# Patient Record
Sex: Female | Born: 1985
Health system: Southern US, Community
[De-identification: ages and names within clinical notes are randomized; demographics above are authoritative.]

## PROBLEM LIST (undated history)

## (undated) DIAGNOSIS — T7840XA Allergy, unspecified, initial encounter: Secondary | ICD-10-CM

## (undated) HISTORY — DX: Allergy, unspecified, initial encounter: T78.40XA

---
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
## 2005-07-01 ENCOUNTER — Other Ambulatory Visit: Admission: RE | Admit: 2005-07-01 | Discharge: 2005-07-01 | Payer: Self-pay | Admitting: Family Medicine

## 2007-03-08 ENCOUNTER — Emergency Department (HOSPITAL_COMMUNITY): Admission: EM | Admit: 2007-03-08 | Discharge: 2007-03-08 | Payer: Self-pay | Admitting: Emergency Medicine

## 2009-03-07 ENCOUNTER — Emergency Department (HOSPITAL_COMMUNITY): Admission: EM | Admit: 2009-03-07 | Discharge: 2009-03-07 | Payer: Self-pay | Admitting: Internal Medicine

## 2012-04-23 ENCOUNTER — Telehealth: Payer: Self-pay | Admitting: Nurse Practitioner

## 2012-04-24 NOTE — Telephone Encounter (Signed)
LMTCB 4/21-NO RETURN CALL YET- BEEN OVER 24 HOURS. PT MAY CALL BACK IF APPT STILL NEEDED

## 2012-10-02 ENCOUNTER — Ambulatory Visit (INDEPENDENT_AMBULATORY_CARE_PROVIDER_SITE_OTHER): Payer: Managed Care, Other (non HMO) | Admitting: Nurse Practitioner

## 2012-10-02 ENCOUNTER — Telehealth: Payer: Self-pay | Admitting: Nurse Practitioner

## 2012-10-02 ENCOUNTER — Encounter: Payer: Self-pay | Admitting: Nurse Practitioner

## 2012-10-02 VITALS — BP 112/75 | HR 86 | Temp 97.8°F | Ht 65.5 in | Wt 107.0 lb

## 2012-10-02 DIAGNOSIS — Z01419 Encounter for gynecological examination (general) (routine) without abnormal findings: Secondary | ICD-10-CM

## 2012-10-02 DIAGNOSIS — Z0000 Encounter for general adult medical examination without abnormal findings: Secondary | ICD-10-CM

## 2012-10-02 DIAGNOSIS — Z124 Encounter for screening for malignant neoplasm of cervix: Secondary | ICD-10-CM

## 2012-10-02 LAB — POCT URINALYSIS DIPSTICK
Blood, UA: NEGATIVE
Glucose, UA: NEGATIVE
Protein, UA: NEGATIVE
Spec Grav, UA: 1.02
pH, UA: 7

## 2012-10-02 MED ORDER — NORETHIN-ETH ESTRAD TRIPHASIC 0.5/0.75/1-35 MG-MCG PO TABS: 1.0000 | ORAL_TABLET | Freq: Every day | ORAL | Status: DC

## 2012-10-02 MED ORDER — IMIQUIMOD 5 % EX CREA: TOPICAL_CREAM | CUTANEOUS | Status: DC

## 2012-10-02 NOTE — Progress Notes (Addendum)
  Subjective:    Patient ID: Amy Ingram, female    DOB: 05-29-1985, 27 y.o.   MRN: [id number]  HPI Patient in today for CPE and PAP. Patoent doing well- no complaints today    Review of Systems  Constitutional: Negative.   HENT: Negative.   Eyes: Negative.   Respiratory: Negative.   Cardiovascular: Negative.   Gastrointestinal: Negative.   Genitourinary: Negative.   Musculoskeletal: Negative.   Neurological: Negative.   Hematological: Negative.        Objective:   Physical Exam  Constitutional: She is oriented to person, place, and time. She appears well-developed and well-nourished.  HENT:  Head: Normocephalic.  Right Ear: Hearing, tympanic membrane, external ear and ear canal normal.  Left Ear: Hearing, tympanic membrane, external ear and ear canal normal.  Nose: Nose normal.  Mouth/Throat: Uvula is midline and oropharynx is clear and moist.  Eyes: Conjunctivae and EOM are normal. Pupils are equal, round, and reactive to light.  Neck: Normal range of motion and full passive range of motion without pain. Neck supple. No JVD present. Carotid bruit is not present. No mass and no thyromegaly present.  Cardiovascular: Normal rate, normal heart sounds and intact distal pulses.   No murmur heard. Pulmonary/Chest: Effort normal and breath sounds normal.  Abdominal: Soft. Bowel sounds are normal. She exhibits no mass. There is no tenderness.  Genitourinary: Vagina normal and uterus normal. No breast swelling, tenderness, discharge or bleeding.  bimanual exam-No adnexal masses or tenderness.  Breast- no masses palpable  Musculoskeletal: Normal range of motion.  Lymphadenopathy:    She has no cervical adenopathy.  Neurological: She is alert and oriented to person, place, and time.  Skin: Skin is warm and dry.  Psychiatric: She has a normal mood and affect. Her behavior is normal. Judgment and thought content normal.     BP 112/75  Pulse 86  Temp(Src) 97.8 F (36.6 C)  (Oral)  Ht 5' 5.5" (1.664 m)  Wt 107 lb (48.535 kg)  BMI 17.53 kg/m2      Assessment & Plan:   1. Annual physical exam   2. Encounter for routine gynecological examination    Orders Placed This Encounter  Procedures  . Urinalysis Dipstick  . POCT hemoglobin   Meds ordered this encounter  Medications  . DISCONTD: norethindrone-ethinyl estradiol (TRIPHASIL,CYCLAFEM,ALYACEN) 0.5/0.75/1-35 MG-MCG tablet    Sig: Take 1 tablet by mouth daily.  . imiquimod (ALDARA) 5 % cream    Sig: Apply topically 3 (three) times a week.    Dispense:  12 each    Refill:  0    Order Specific Question:  Supervising Provider    Answer:  Ernestina Penna [[id number]]  . norethindrone-ethinyl estradiol (TRIPHASIL,CYCLAFEM,ALYACEN) 0.5/0.75/1-35 MG-MCG tablet    Sig: Take 1 tablet by mouth daily.    Dispense:  1 Package    Refill:  11    Order Specific Question:  Supervising Provider    Answer:  Ernestina Penna [[id number]]   LABs pending Increase calories in diet  .mmmms

## 2012-10-02 NOTE — Patient Instructions (Signed)

## 2012-10-03 NOTE — Telephone Encounter (Signed)
I ordered what was in computer- call pharmacy and see what she was on so we can compare

## 2012-10-03 NOTE — Telephone Encounter (Signed)
Please advise 

## 2012-10-04 NOTE — Telephone Encounter (Signed)
Taken care.

## 2012-10-05 LAB — PAP IG, CT-NG, RFX HPV ASCU

## 2012-10-25 ENCOUNTER — Encounter: Payer: Self-pay | Admitting: *Deleted

## 2013-03-04 ENCOUNTER — Telehealth: Payer: Self-pay | Admitting: Nurse Practitioner

## 2013-03-04 NOTE — Telephone Encounter (Signed)
appt tomorrow with mmm 

## 2013-03-05 ENCOUNTER — Ambulatory Visit (INDEPENDENT_AMBULATORY_CARE_PROVIDER_SITE_OTHER): Payer: Managed Care, Other (non HMO) | Admitting: Nurse Practitioner

## 2013-03-05 ENCOUNTER — Encounter: Payer: Self-pay | Admitting: Nurse Practitioner

## 2013-03-05 VITALS — BP 112/69 | HR 80 | Temp 97.4°F | Ht 65.5 in | Wt 108.0 lb

## 2013-03-05 DIAGNOSIS — N9089 Other specified noninflammatory disorders of vulva and perineum: Secondary | ICD-10-CM

## 2013-03-05 DIAGNOSIS — R300 Dysuria: Secondary | ICD-10-CM

## 2013-03-05 LAB — POCT URINALYSIS DIPSTICK
Bilirubin, UA: NEGATIVE
GLUCOSE UA: NEGATIVE
Ketones, UA: NEGATIVE
Leukocytes, UA: NEGATIVE
Nitrite, UA: NEGATIVE
PROTEIN UA: NEGATIVE
Spec Grav, UA: 1.02
Urobilinogen, UA: NEGATIVE
pH, UA: 6.5

## 2013-03-05 LAB — POCT UA - MICROSCOPIC ONLY
Bacteria, U Microscopic: NEGATIVE
CRYSTALS, UR, HPF, POC: NEGATIVE
Casts, Ur, LPF, POC: NEGATIVE
Mucus, UA: NEGATIVE
WBC, Ur, HPF, POC: NEGATIVE
Yeast, UA: NEGATIVE

## 2013-03-05 MED ORDER — METRONIDAZOLE 500 MG PO TABS: 500.0000 mg | ORAL_TABLET | Freq: Two times a day (BID) | ORAL | Status: DC

## 2013-03-05 NOTE — Progress Notes (Signed)
Subjective:    Patient ID: Amy Ingram, female    DOB: 08/18/1985, 28 y.o.   MRN: [id number]  Urinary Tract Infection  This is a new problem. The current episode started in the past 7 days. The problem occurs intermittently. The problem has been unchanged. The pain is at a severity of 6/10. The pain is moderate. There has been no fever. She is sexually active. Associated symptoms include frequency and urgency. Pertinent negatives include no discharge, flank pain or hematuria. She has tried increased fluids for the symptoms. The treatment provided no relief. Her past medical history is significant for kidney stones.     Review of Systems  Respiratory: Negative for shortness of breath.   Cardiovascular: Negative for chest pain.  Genitourinary: Positive for urgency and frequency. Negative for hematuria and flank pain.  All other systems reviewed and are negative.       Objective:   Physical Exam  Constitutional: She is oriented to person, place, and time. She appears well-developed and well-nourished.  Eyes: EOM are normal.  Neck: Trachea normal and full passive range of motion without pain. Carotid bruit is not present.  Cardiovascular: Normal rate, regular rhythm, normal heart sounds and intact distal pulses.   Pulmonary/Chest: Effort normal and breath sounds normal.  Abdominal: Soft. Bowel sounds are normal. There is tenderness.  Genitourinary:  Erythematous, raw appearing lesion of the clitoris   Musculoskeletal: Normal range of motion.  Neurological: She is alert and oriented to person, place, and time. She has normal reflexes.  Skin: Skin is warm and dry.  Psychiatric: She has a normal mood and affect. Her behavior is normal. Judgment and thought content normal.    BP 112/69  Pulse 80  Temp(Src) 97.4 F (36.3 C) (Oral)  Ht 5' 5.5" (1.664 m)  Wt 108 lb (48.988 kg)  BMI 17.69 kg/m2   Results for orders placed in visit on 03/05/13  POCT URINALYSIS DIPSTICK   Result Value Ref Range   Color, UA yellow     Clarity, UA clear     Glucose, UA neg     Bilirubin, UA neg     Ketones, UA neg     Spec Grav, UA 1.020     Blood, UA trace     pH, UA 6.5     Protein, UA neg     Urobilinogen, UA negative     Nitrite, UA neg     Leukocytes, UA Negative    POCT UA - MICROSCOPIC ONLY      Result Value Ref Range   WBC, Ur, HPF, POC neg     RBC, urine, microscopic 5-10     Bacteria, U Microscopic neg     Mucus, UA neg     Epithelial cells, urine per micros occ     Crystals, Ur, HPF, POC neg     Casts, Ur, LPF, POC neg     Yeast, UA neg         Assessment & Plan:   1. Clitoral irritation   2. Dysuria     Meds ordered this encounter  Medications  . metroNIDAZOLE (FLAGYL) 500 MG tablet    Sig: Take 1 tablet (500 mg total) by mouth 2 (two) times daily.    Dispense:  14 tablet    Refill:  0    Order Specific Question:  Supervising Provider    Answer:  Ernestina PennaMOORE, DONALD W [[id number]]   Apply vasaline or antibiotic BID daily Cold or warm compresses  whichever feels better RTC if no improvement or worsens  Mary-Margaret Daphine Deutscher, FNP

## 2013-07-29 ENCOUNTER — Encounter: Payer: Self-pay | Admitting: Family Medicine

## 2013-07-29 ENCOUNTER — Ambulatory Visit (INDEPENDENT_AMBULATORY_CARE_PROVIDER_SITE_OTHER): Payer: Managed Care, Other (non HMO) | Admitting: Family Medicine

## 2013-07-29 VITALS — BP 116/77 | HR 82 | Temp 97.0°F | Ht 65.5 in | Wt 115.2 lb

## 2013-07-29 DIAGNOSIS — B353 Tinea pedis: Secondary | ICD-10-CM

## 2013-07-29 DIAGNOSIS — H8110 Benign paroxysmal vertigo, unspecified ear: Secondary | ICD-10-CM

## 2013-07-29 DIAGNOSIS — R531 Weakness: Secondary | ICD-10-CM

## 2013-07-29 DIAGNOSIS — R110 Nausea: Secondary | ICD-10-CM

## 2013-07-29 DIAGNOSIS — R5383 Other fatigue: Secondary | ICD-10-CM

## 2013-07-29 DIAGNOSIS — R5381 Other malaise: Secondary | ICD-10-CM

## 2013-07-29 LAB — POCT URINALYSIS DIPSTICK
Bilirubin, UA: NEGATIVE
Blood, UA: NEGATIVE
Glucose, UA: NEGATIVE
Ketones, UA: NEGATIVE
Leukocytes, UA: NEGATIVE
Nitrite, UA: NEGATIVE
Protein, UA: NEGATIVE
Spec Grav, UA: <=1.005
Urobilinogen, UA: NEGATIVE
pH, UA: 7.5

## 2013-07-29 LAB — POCT UA - MICROSCOPIC ONLY
Bacteria, U Microscopic: NEGATIVE
Casts, Ur, LPF, POC: NEGATIVE
Crystals, Ur, HPF, POC: NEGATIVE
Mucus, UA: NEGATIVE
RBC, urine, microscopic: NEGATIVE
Yeast, UA: NEGATIVE

## 2013-07-29 LAB — POCT URINE PREGNANCY: Preg Test, Ur: NEGATIVE

## 2013-07-29 MED ORDER — KETOCONAZOLE 2 % EX CREA: 1.0000 "application " | TOPICAL_CREAM | Freq: Every day | CUTANEOUS | Status: DC

## 2013-07-29 MED ORDER — MECLIZINE HCL 25 MG PO TABS: 25.0000 mg | ORAL_TABLET | Freq: Three times a day (TID) | ORAL | Status: DC | PRN

## 2013-07-29 MED ORDER — AZITHROMYCIN 250 MG PO TABS: ORAL_TABLET | ORAL | Status: DC

## 2013-07-29 MED ORDER — METHYLPREDNISOLONE ACETATE 80 MG/ML IJ SUSP
80.0000 mg | Freq: Once | INTRAMUSCULAR | Status: AC
Start: 2013-07-29 — End: 2013-07-29
  Administered 2013-07-29: 80 mg via INTRAMUSCULAR

## 2013-07-29 MED ORDER — TERBINAFINE HCL 250 MG PO TABS: 250.0000 mg | ORAL_TABLET | Freq: Every day | ORAL | Status: DC

## 2013-07-29 NOTE — Progress Notes (Signed)
   Subjective:    Patient ID: Amy Ingram, female    DOB: May 27, 1985, 28 y.o.   MRN: [id number]  HPI This 28 y.o. female presents for evaluation of dizziness and spells.  She states she has been having a few episodes over the past few weeks.  She is having a rash in her right toes.   Review of Systems C/o rash No chest pain, SOB, HA, dizziness, vision change, N/V, diarrhea, constipation, dysuria, urinary urgency or frequency, myalgias, arthralgias or rash.     Objective:   Physical Exam Vital signs noted  Well developed well nourished female.  HEENT - Head atraumatic Normocephalic                Eyes - PERRLA, Conjuctiva - clear Sclera- Clear EOMI                Ears - EAC's Wnl TM's Wnl Gross Hearing WNL                Throat - oropharanx wnl Respiratory - Lungs CTA bilateral Cardiac - RRR S1 and S2 without murmur GI - Abdomen soft Nontender and bowel sounds active x 4 Extremities - No edema. Neuro - Grossly intact.   Results for orders placed in visit on 07/29/13  POCT URINALYSIS DIPSTICK      Result Value Ref Range   Color, UA yellow     Clarity, UA clear     Glucose, UA neg     Bilirubin, UA neg     Ketones, UA neg     Spec Grav, UA <=1.005     Blood, UA neg     pH, UA 7.5     Protein, UA neg     Urobilinogen, UA negative     Nitrite, UA neg     Leukocytes, UA Negative    POCT URINE PREGNANCY      Result Value Ref Range   Preg Test, Ur Negative    POCT UA - MICROSCOPIC ONLY      Result Value Ref Range   WBC, Ur, HPF, POC rare     RBC, urine, microscopic neg     Bacteria, U Microscopic neg     Mucus, UA neg     Epithelial cells, urine per micros occ     Crystals, Ur, HPF, POC neg     Casts, Ur, LPF, POC neg     Yeast, UA neg        Assessment & Plan:  Weakness generalized - Plan: POCT urinalysis dipstick, POCT urine pregnancy, POCT UA - Microscopic Only, azithromycin (ZITHROMAX) 250 MG tablet, meclizine (ANTIVERT) 25 MG tablet, methylPREDNISolone  acetate (DEPO-MEDROL) injection 80 mg  Nausea alone - Plan: POCT urinalysis dipstick, POCT urine pregnancy, POCT UA - Microscopic Only, azithromycin (ZITHROMAX) 250 MG tablet, meclizine (ANTIVERT) 25 MG tablet, methylPREDNISolone acetate (DEPO-MEDROL) injection 80 mg  Benign paroxysmal positional vertigo, unspecified laterality - Plan: azithromycin (ZITHROMAX) 250 MG tablet, meclizine (ANTIVERT) 25 MG tablet, methylPREDNISolone acetate (DEPO-MEDROL) injection 80 mg  Tinea pedis of right foot - Plan: ketoconazole (NIZORAL) 2 % cream, terbinafine (LAMISIL) 250 MG tablet  Deatra CanterWilliam J Alby Schwabe FNP

## 2013-12-17 ENCOUNTER — Ambulatory Visit (INDEPENDENT_AMBULATORY_CARE_PROVIDER_SITE_OTHER): Payer: Managed Care, Other (non HMO) | Admitting: Nurse Practitioner

## 2013-12-17 ENCOUNTER — Encounter: Payer: Self-pay | Admitting: Nurse Practitioner

## 2013-12-17 VITALS — BP 95/70 | HR 79 | Temp 97.3°F | Ht 65.0 in | Wt 110.0 lb

## 2013-12-17 DIAGNOSIS — Z0000 Encounter for general adult medical examination without abnormal findings: Secondary | ICD-10-CM

## 2013-12-17 DIAGNOSIS — Z01419 Encounter for gynecological examination (general) (routine) without abnormal findings: Secondary | ICD-10-CM

## 2013-12-17 MED ORDER — CYCLOBENZAPRINE HCL 10 MG PO TABS: 10.0000 mg | ORAL_TABLET | Freq: Three times a day (TID) | ORAL | Status: DC | PRN

## 2013-12-17 NOTE — Patient Instructions (Signed)

## 2013-12-17 NOTE — Progress Notes (Signed)
   Subjective:    Patient ID: Amy Ingram, female    DOB: 1985/12/29, 28 y.o.   MRN: [id number]  HPI Patient here today for CPE and PAP- She is doing well today without complaints. Sh ehas no curent medical problems and is on no chronic meds.    Review of Systems  Constitutional: Negative.   HENT: Negative.   Respiratory: Negative.   Cardiovascular: Negative.   Gastrointestinal: Negative.   Genitourinary: Negative.   Neurological: Negative.   Psychiatric/Behavioral: Negative.   All other systems reviewed and are negative.      Objective:   Physical Exam  Constitutional: She is oriented to person, place, and time. She appears well-developed and well-nourished.  HENT:  Head: Normocephalic.  Right Ear: Hearing, tympanic membrane, external ear and ear canal normal.  Left Ear: Hearing, tympanic membrane, external ear and ear canal normal.  Nose: Nose normal.  Mouth/Throat: Uvula is midline and oropharynx is clear and moist.  Eyes: Conjunctivae and EOM are normal. Pupils are equal, round, and reactive to light.  Neck: Normal range of motion and full passive range of motion without pain. Neck supple. No JVD present. Carotid bruit is not present. No thyroid mass and no thyromegaly present.  Cardiovascular: Normal rate, normal heart sounds and intact distal pulses.   No murmur heard. Pulmonary/Chest: Effort normal and breath sounds normal. Right breast exhibits no inverted nipple, no mass, no nipple discharge, no skin change and no tenderness. Left breast exhibits no inverted nipple, no mass, no nipple discharge, no skin change and no tenderness.  Abdominal: Soft. Bowel sounds are normal. She exhibits no mass. There is no tenderness.  Genitourinary: Vagina normal and uterus normal. No breast swelling, tenderness, discharge or bleeding.  bimanual exam-No adnexal masses or tenderness. Cervix non parous and pink- no discharge Condyloma lesion on left perineum  Musculoskeletal: Normal  range of motion.  Lymphadenopathy:    She has no cervical adenopathy.  Neurological: She is alert and oriented to person, place, and time.  Skin: Skin is warm and dry.  Psychiatric: She has a normal mood and affect. Her behavior is normal. Judgment and thought content normal.   BP 95/70 mmHg  Pulse 79  Temp(Src) 97.3 F (36.3 C) (Oral)  Ht 5\' 5"  (1.651 m)  Wt 110 lb (49.896 kg)  BMI 18.31 kg/m2        Assessment & Plan:

## 2013-12-17 NOTE — Addendum Note (Signed)
Addended by: Tommas OlpHANDY, ASHLEY N on: 12/17/2013 11:22 AM   Modules accepted: Orders

## 2013-12-18 LAB — CBC WITH DIFFERENTIAL
BASOS ABS: 0 10*3/uL (ref 0.0–0.2)
Basos: 1 %
EOS: 3 %
Eosinophils Absolute: 0.2 10*3/uL (ref 0.0–0.4)
HEMATOCRIT: 41.4 % (ref 34.0–46.6)
HEMOGLOBIN: 14.1 g/dL (ref 11.1–15.9)
IMMATURE GRANS (ABS): 0 10*3/uL (ref 0.0–0.1)
IMMATURE GRANULOCYTES: 0 %
LYMPHS: 37 %
Lymphocytes Absolute: 2.2 10*3/uL (ref 0.7–3.1)
MCH: 31 pg (ref 26.6–33.0)
MCHC: 34.1 g/dL (ref 31.5–35.7)
MCV: 91 fL (ref 79–97)
MONOCYTES: 7 %
Monocytes Absolute: 0.4 10*3/uL (ref 0.1–0.9)
Neutrophils Absolute: 3.2 10*3/uL (ref 1.4–7.0)
Neutrophils Relative %: 52 %
PLATELETS: 220 10*3/uL (ref 150–379)
RBC: 4.55 x10E6/uL (ref 3.77–5.28)
RDW: 13.1 % (ref 12.3–15.4)
WBC: 6 10*3/uL (ref 3.4–10.8)

## 2013-12-18 LAB — CMP14+EGFR
ALBUMIN: 4.4 g/dL (ref 3.5–5.5)
ALT: 10 IU/L (ref 0–32)
AST: 10 IU/L (ref 0–40)
Albumin/Globulin Ratio: 2 (ref 1.1–2.5)
Alkaline Phosphatase: 55 IU/L (ref 39–117)
BILIRUBIN TOTAL: 0.6 mg/dL (ref 0.0–1.2)
BUN/Creatinine Ratio: 25 — ABNORMAL HIGH (ref 8–20)
BUN: 16 mg/dL (ref 6–20)
CO2: 22 mmol/L (ref 18–29)
Calcium: 9 mg/dL (ref 8.7–10.2)
Chloride: 102 mmol/L (ref 97–108)
Creatinine, Ser: 0.65 mg/dL (ref 0.57–1.00)
GFR, EST AFRICAN AMERICAN: 140 mL/min/{1.73_m2} (ref >59)
GFR, EST NON AFRICAN AMERICAN: 121 mL/min/{1.73_m2} (ref >59)
GLUCOSE: 79 mg/dL (ref 65–99)
Globulin, Total: 2.2 g/dL (ref 1.5–4.5)
Potassium: 4.4 mmol/L (ref 3.5–5.2)
Sodium: 138 mmol/L (ref 134–144)
TOTAL PROTEIN: 6.6 g/dL (ref 6.0–8.5)

## 2013-12-18 LAB — NMR, LIPOPROFILE
CHOLESTEROL: 154 mg/dL (ref 100–199)
HDL Cholesterol by NMR: 64 mg/dL (ref >39)
HDL Particle Number: 34.8 umol/L (ref >=30.5)
LDL PARTICLE NUMBER: 813 nmol/L (ref <1000)
LDL SIZE: 20.7 nm (ref >20.5)
LDL-C: 75 mg/dL (ref 0–99)
LP-IR Score: 43 (ref <=45)
SMALL LDL PARTICLE NUMBER: 253 nmol/L (ref <=527)
TRIGLYCERIDES BY NMR: 77 mg/dL (ref 0–149)

## 2013-12-18 LAB — THYROID PANEL WITH TSH
FREE THYROXINE INDEX: 2.6 (ref 1.2–4.9)
T3 UPTAKE RATIO: 35 % (ref 24–39)
T4, Total: 7.3 ug/dL (ref 4.5–12.0)
TSH: 0.733 u[IU]/mL (ref 0.450–4.500)

## 2013-12-20 ENCOUNTER — Telehealth: Payer: Self-pay

## 2013-12-20 LAB — PAP IG, CT-NG, RFX HPV ASCU
Chlamydia, Nuc. Acid Amp: NEGATIVE
Gonococcus by Nucleic Acid Amp: NEGATIVE
PAP Smear Comment: 0

## 2013-12-20 NOTE — Telephone Encounter (Signed)
-----   Message from [facility] Center[facility]-Margaret Martin, FNP sent at 12/19/2013  6:35 PM EST ----- Cbc- normal Kidney and liver function stable Cholesterol looks great Thyroid panel normal Continue current meds- low fat diet and exercise and recheck in 3 months

## 2013-12-20 NOTE — Telephone Encounter (Signed)
Letter sent with results

## 2014-07-04 ENCOUNTER — Emergency Department (HOSPITAL_COMMUNITY)
Admission: EM | Admit: 2014-07-04 | Discharge: 2014-07-04 | Disposition: A | Discharge status: Home / Self Care | Payer: 59 | Attending: Emergency Medicine | Admitting: Emergency Medicine

## 2014-07-04 ENCOUNTER — Encounter (HOSPITAL_COMMUNITY): Payer: Self-pay | Admitting: *Deleted

## 2014-07-04 DIAGNOSIS — S00412A Abrasion of left ear, initial encounter: Secondary | ICD-10-CM | POA: Diagnosis not present

## 2014-07-04 DIAGNOSIS — H6092 Unspecified otitis externa, left ear: Secondary | ICD-10-CM | POA: Diagnosis not present

## 2014-07-04 DIAGNOSIS — Y998 Other external cause status: Secondary | ICD-10-CM | POA: Insufficient documentation

## 2014-07-04 DIAGNOSIS — Y9389 Activity, other specified: Secondary | ICD-10-CM | POA: Diagnosis not present

## 2014-07-04 DIAGNOSIS — X58XXXA Exposure to other specified factors, initial encounter: Secondary | ICD-10-CM | POA: Diagnosis not present

## 2014-07-04 DIAGNOSIS — Y929 Unspecified place or not applicable: Secondary | ICD-10-CM | POA: Diagnosis not present

## 2014-07-04 DIAGNOSIS — H9222 Otorrhagia, left ear: Secondary | ICD-10-CM | POA: Diagnosis present

## 2014-07-04 DIAGNOSIS — Z720 Tobacco use: Secondary | ICD-10-CM | POA: Diagnosis not present

## 2014-07-04 MED ORDER — OFLOXACIN 0.3 % OT SOLN: 10.0000 [drp] | Freq: Every day | OTIC | Status: DC

## 2014-07-04 NOTE — ED Provider Notes (Signed)
CSN: [id number]     Arrival date & time 07/04/14  1728 History  This chart was scribed for non-physician practitioner, Eyvonne MechanicJeffrey Takisha Pelle, PA-C, working with No att. providers found by Bethel BornBritney McCollum, ED Scribe. This patient was seen in room TR10C/TR10C and the patient's care was started at 5:54 PM.    Chief Complaint  Patient presents with  . Ear Drainage    The history is provided by the patient. No language interpreter was used.   Amy Ingram is a 29 y.o. female with PMHx of seasonal allergies who presents to the Emergency Department complaining of bleeding from the left ear with onset around 5 PM. The pt was at work when she noticed dried blood around her ear.Associated symptoms include mild ear pain that is described as pressure. She notes cleaning her ears with Q-tips this morning. Pt denies hearing loss. No antihistamine use.   Past Medical History  Diagnosis Date  . Allergy    History reviewed. No pertinent past surgical history. No family history on file. History  Substance Use Topics  . Smoking status: Current Every Day Smoker  . Smokeless tobacco: Not on file  . Alcohol Use: Yes   OB History    No data available     Review of Systems  All other systems reviewed and are negative.    Allergies  Review of patient's allergies indicates no known allergies.  Home Medications   Prior to Admission medications   Medication Sig Start Date End Date Taking? Authorizing Provider  cyclobenzaprine (FLEXERIL) 10 MG tablet Take 1 tablet (10 mg total) by mouth 3 (three) times daily as needed for muscle spasms. 12/17/13   Mary-Margaret Daphine DeutscherMartin, FNP  imiquimod Mathis Dad(ALDARA) 5 % cream Apply topically 3 (three) times a week. Patient not taking: Reported on 12/17/2013 10/02/12   Mary-Margaret Daphine DeutscherMartin, FNP  ketoconazole (NIZORAL) 2 % cream Apply 1 application topically daily. Patient not taking: Reported on 12/17/2013 07/29/13   Deatra CanterWilliam J Oxford, FNP  meclizine (ANTIVERT) 25 MG tablet Take 1  tablet (25 mg total) by mouth 3 (three) times daily as needed for dizziness. Patient not taking: Reported on 12/17/2013 07/29/13   Deatra CanterWilliam J Oxford, FNP  ofloxacin (FLOXIN) 0.3 % otic solution Place 10 drops into the left ear daily. Please use for 7 days 07/04/14   Eyvonne MechanicJeffrey Deandra Goering, PA-C  terbinafine (LAMISIL) 250 MG tablet Take 1 tablet (250 mg total) by mouth daily. Patient not taking: Reported on 12/17/2013 07/29/13   Deatra CanterWilliam J Oxford, FNP   BP 137/77 mmHg  Pulse 68  Temp(Src) 97.7 F (36.5 C) (Oral)  Resp 16  SpO2 100%  LMP 06/30/2014 Physical Exam  Constitutional: She is oriented to person, place, and time. She appears well-developed and well-nourished. No distress.  HENT:  Head: Normocephalic and atraumatic.  Minor superficial abrasion to posterior external ear canal  Eyes: Conjunctivae and EOM are normal.  Neck: Neck supple. No tracheal deviation present.  Cardiovascular: Normal rate.   Pulmonary/Chest: Effort normal. No respiratory distress.  Musculoskeletal: Normal range of motion.  Neurological: She is alert and oriented to person, place, and time.  Skin: Skin is warm and dry.  Psychiatric: She has a normal mood and affect. Her behavior is normal.  Nursing note and vitals reviewed.   ED Course  Procedures  DIAGNOSTIC STUDIES: Oxygen Saturation is 100% on RA, normal by my interpretation.    COORDINATION OF CARE: 5:56 PM Discussed treatment plan which includes plan to discharge to f/u with PCP with pt  at bedside and pt agreed to plan.  Labs Review Labs Reviewed - No data to display  Imaging Review No results found.   EKG Interpretation None      MDM   Final diagnoses:  Otitis externa, left    Labs  Imaging:   Consults   Therapeutics:   Assessment:  Plan: Patient presents with bleeding to the left ear. Visual inspection shows minor erythema in the external auditory canal, with a superficial abrasion. Bleeding was controlled during her stay in the ED.  She was placed on otic drops for likely otitis externa which caused friability of the tissue, patient uses Q-tips likely caused an abrasion. Patient had no mastoid tenderness no soft tissue/erythema that would indicate surrounding infection. Patient is instructed follow-up with her primary care provider in 3 days for reevaluation of symptoms. Strict return precautions given. Patient verbalizes understanding and agreement today's plan and had no further questions or concerns at time of discharge.   I personally performed the services described in this documentation, which was scribed in my presence. The recorded information has been reviewed and is accurate.   Eyvonne Mechanic, PA-C 07/04/14 2248  Blake Divine, MD 07/05/14 [phone]

## 2014-07-04 NOTE — ED Notes (Signed)
Pt presents c/o bleeding from left ear.  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 Pt denies trauma to ear, denies any hearing deficits.  Pt a x 4, NAD.

## 2014-07-04 NOTE — Discharge Instructions (Signed)
Please use antibiotic since directed. Please monitor for worsening signs or symptoms, return immediately if any present. Please follow-up with her primary care provider in 3-5 days if symptoms continue to persist.

## 2014-07-04 NOTE — Progress Notes (Signed)
   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

## 2014-07-14 ENCOUNTER — Telehealth: Payer: Self-pay | Admitting: Nurse Practitioner

## 2014-07-14 ENCOUNTER — Ambulatory Visit (INDEPENDENT_AMBULATORY_CARE_PROVIDER_SITE_OTHER): Payer: 59 | Admitting: Nurse Practitioner

## 2014-07-14 ENCOUNTER — Encounter: Payer: Self-pay | Admitting: Nurse Practitioner

## 2014-07-14 VITALS — BP 107/72 | HR 98 | Temp 97.8°F | Ht 65.0 in | Wt 111.0 lb

## 2014-07-14 DIAGNOSIS — J0100 Acute maxillary sinusitis, unspecified: Secondary | ICD-10-CM

## 2014-07-14 MED ORDER — AMOXICILLIN 875 MG PO TABS: 875.0000 mg | ORAL_TABLET | Freq: Two times a day (BID) | ORAL | Status: DC

## 2014-07-14 NOTE — Patient Instructions (Signed)

## 2014-07-14 NOTE — Telephone Encounter (Signed)
Appointment given for today @ 3:15

## 2014-07-14 NOTE — Progress Notes (Signed)
  History was provided by the patient.  Amy Ingram is a 29 y.o. female who is here for bilateral ear pain that started Saturday night and worsened into yesterday. Had OE 1 week ago and was given ear drops- started hurting again Saturday.    The following portions of the patient's history were reviewed and updated as appropriate: allergies, current medications, past family history, past medical history, past social history, past surgical history and problem list.  Physical Exam:  BP 107/72 mmHg  Pulse 98  Temp(Src) 97.8 F (36.6 C) (Oral)  Ht 5\' 5"  (1.651 m)  Wt 111 lb (50.349 kg)  BMI 18.47 kg/m2  LMP 06/30/2014  Facility age limit for growth percentiles is 20 years. Patient's last menstrual period was 06/30/2014.    General:   alert and cooperative     Skin:   normal  Oral cavity:   lips, mucosa, and tongue normal; teeth and gums normal  Eyes:   sclerae white, pupils equal and reactive, red reflex normal bilaterally  Ears:   normal bilaterally  Nose: clear, no discharge bil maxillary sinus pressure  Neck:  Neck appearance: Normal  Lungs:  clear to auscultation bilaterally  Heart:   regular rate and rhythm, S1, S2 normal, no murmur, click, rub or gallop   Abdomen:  soft, non-tender; bowel sounds normal; no masses,  no organomegaly  GU:  normal female  Extremities:   extremities normal, atraumatic, no cyanosis or edema  Neuro:  normal without focal findings, PERLA and reflexes normal and symmetric    Assessment/Plan:  1. Acute maxillary sinusitis, recurrence not specified 1. Take meds as prescribed 2. Use a cool mist humidifier especially during the winter months and when heat has been humid. 3. Use saline nose sprays frequently 4. Saline irrigations of the nose can be very helpful if done frequently.  * 4X daily for 1 week*  * Use of a nettie pot can be helpful with this. Follow directions with this* 5. Drink plenty of fluids 6. Keep thermostat turn down low 7.For  any cough or congestion  Use plain Mucinex- regular strength or max strength is fine   * Children- consult with Pharmacist for dosing 8. For fever or aces or pains- take tylenol or ibuprofen appropriate for age and weight.  * for fevers greater than 101 orally you may alternate ibuprofen and tylenol every  3 hours.    - amoxicillin (AMOXIL) 875 MG tablet; Take 1 tablet (875 mg total) by mouth 2 (two) times daily. 1 po BID  Dispense: 20 tablet; Refill: 0  Mary-Margaret Daphine DeutscherMartin, FNP

## 2014-12-23 ENCOUNTER — Ambulatory Visit (INDEPENDENT_AMBULATORY_CARE_PROVIDER_SITE_OTHER): Payer: 59 | Admitting: Nurse Practitioner

## 2014-12-23 ENCOUNTER — Encounter: Payer: Self-pay | Admitting: Nurse Practitioner

## 2014-12-23 VITALS — BP 107/72 | HR 74 | Temp 97.4°F | Ht 65.0 in | Wt 111.0 lb

## 2014-12-23 DIAGNOSIS — Z0000 Encounter for general adult medical examination without abnormal findings: Secondary | ICD-10-CM | POA: Diagnosis not present

## 2014-12-23 DIAGNOSIS — Z01419 Encounter for gynecological examination (general) (routine) without abnormal findings: Secondary | ICD-10-CM | POA: Diagnosis not present

## 2014-12-23 LAB — POCT URINALYSIS DIPSTICK
BILIRUBIN UA: NEGATIVE
Blood, UA: NEGATIVE
Glucose, UA: NEGATIVE
KETONES UA: NEGATIVE
Nitrite, UA: NEGATIVE
Protein, UA: NEGATIVE
Spec Grav, UA: 1.01
Urobilinogen, UA: NEGATIVE
pH, UA: 7.5

## 2014-12-23 LAB — POCT UA - MICROSCOPIC ONLY
Bacteria, U Microscopic: NEGATIVE
Casts, Ur, LPF, POC: NEGATIVE
Crystals, Ur, HPF, POC: NEGATIVE
MUCUS UA: NEGATIVE
RBC, URINE, MICROSCOPIC: NEGATIVE
YEAST UA: NEGATIVE

## 2014-12-23 NOTE — Addendum Note (Signed)
Addended by: Bennie PieriniMARTIN, MARY-MARGARET on: 12/23/2014 11:33 AM   Modules accepted: Kipp BroodSmartSet

## 2014-12-23 NOTE — Progress Notes (Signed)
   Subjective:    Patient ID: Amy Ingram, female    DOB: [birth year], 29 y.o.   MRN: [id number]  HPI  Patient here today for CPE and PAP- She is doing well today without complaints. She has no curent medical problems and is on no chronic meds.    Review of Systems  Constitutional: Negative.   HENT: Negative.   Respiratory: Negative.   Cardiovascular: Negative.   Gastrointestinal: Negative.   Genitourinary: Negative.   Neurological: Negative.   Psychiatric/Behavioral: Negative.   All other systems reviewed and are negative.      Objective:   Physical Exam  Constitutional: She is oriented to person, place, and time. She appears well-developed and well-nourished.  HENT:  Head: Normocephalic.  Right Ear: Hearing, tympanic membrane, external ear and ear canal normal.  Left Ear: Hearing, tympanic membrane, external ear and ear canal normal.  Nose: Nose normal.  Mouth/Throat: Uvula is midline and oropharynx is clear and moist.  Eyes: Conjunctivae and EOM are normal. Pupils are equal, round, and reactive to light.  Neck: Normal range of motion and full passive range of motion without pain. Neck supple. No JVD present. Carotid bruit is not present. No thyroid mass and no thyromegaly present.  Cardiovascular: Normal rate, normal heart sounds and intact distal pulses.   No murmur heard. Pulmonary/Chest: Effort normal and breath sounds normal. Right breast exhibits no inverted nipple, no mass, no nipple discharge, no skin change and no tenderness. Left breast exhibits no inverted nipple, no mass, no nipple discharge, no skin change and no tenderness.  Abdominal: Soft. Bowel sounds are normal. She exhibits no mass. There is no tenderness.  Genitourinary: Vagina normal and uterus normal. No breast swelling, tenderness, discharge or bleeding.  bimanual exam-No adnexal masses or tenderness. Cervix non parous and pink- no discharge Condyloma lesion on left perineum  Musculoskeletal: Normal  range of motion.  Lymphadenopathy:    She has no cervical adenopathy.  Neurological: She is alert and oriented to person, place, and time.  Skin: Skin is warm and dry.  Psychiatric: She has a normal mood and affect. Her behavior is normal. Judgment and thought content normal.   BP 107/72 mmHg  Pulse 74  Temp(Src) 97.4 F (36.3 C) (Oral)  Ht _0  (1.651 m)  Wt 111 lb (50.349 kg)  BMI 18.47 kg/m2        Assessment & Plan:  1. Annual physical exam  - POCT UA - Microscopic Only - POCT urinalysis dipstick - Pap IG, CT/NG w/ reflex HPV when ASC-U  2. Encounter for routine gynecological examination  - CMP14+EGFR - Lipid panel - CBC with Differential/Platelet - Thyroid Panel With TSH   Mary-Margaret Hassell Done, FNP

## 2014-12-23 NOTE — Patient Instructions (Signed)
Health Maintenance, Female Adopting a healthy lifestyle and getting preventive care can go a long way to promote health and wellness. Talk with your health care provider about what schedule of regular examinations is right for you. This is a good chance for you to check in with your provider about disease prevention and staying healthy. In between checkups, there are plenty of things you can do on your own. Experts have done a lot of research about which lifestyle changes and preventive measures are most likely to keep you healthy. Ask your health care provider for more information. WEIGHT AND DIET  Eat a healthy diet  Be sure to include plenty of vegetables, fruits, low-fat dairy products, and lean protein.  Do not eat a lot of foods high in solid fats, added sugars, or salt.  Get regular exercise. This is one of the most important things you can do for your health.  Most adults should exercise for at least 150 minutes each week. The exercise should increase your heart rate and make you sweat (moderate-intensity exercise).  Most adults should also do strengthening exercises at least twice a week. This is in addition to the moderate-intensity exercise.  Maintain a healthy weight  Body mass index (BMI) is a measurement that can be used to identify possible weight problems. It estimates body fat based on height and weight. Your health care provider can help determine your BMI and help you achieve or maintain a healthy weight.  For females 20 years of age and older:   A BMI below 18.5 is considered underweight.  A BMI of 18.5 to 24.9 is normal.  A BMI of 25 to 29.9 is considered overweight.  A BMI of 30 and above is considered obese.  Watch levels of cholesterol and blood lipids  You should start having your blood tested for lipids and cholesterol at 29 years of age, then have this test every 5 years.  You may need to have your cholesterol levels checked more often if:  Your lipid  or cholesterol levels are high.  You are older than 29 years of age.  You are at high risk for heart disease.  CANCER SCREENING   Lung Cancer  Lung cancer screening is recommended for adults 55-80 years old who are at high risk for lung cancer because of a history of smoking.  A yearly low-dose CT scan of the lungs is recommended for people who:  Currently smoke.  Have quit within the past 15 years.  Have at least a 30-pack-year history of smoking. A pack year is smoking an average of one pack of cigarettes a day for 1 year.  Yearly screening should continue until it has been 15 years since you quit.  Yearly screening should stop if you develop a health problem that would prevent you from having lung cancer treatment.  Breast Cancer  Practice breast self-awareness. This means understanding how your breasts normally appear and feel.  It also means doing regular breast self-exams. Let your health care provider know about any changes, no matter how small.  If you are in your 20s or 30s, you should have a clinical breast exam (CBE) by a health care provider every 1-3 years as part of a regular health exam.  If you are 40 or older, have a CBE every year. Also consider having a breast X-ray (mammogram) every year.  If you have a family history of breast cancer, talk to your health care provider about genetic screening.  If you   are at high risk for breast cancer, talk to your health care provider about having an MRI and a mammogram every year.  Breast cancer gene (BRCA) assessment is recommended for women who have family members with BRCA-related cancers. BRCA-related cancers include:  Breast.  Ovarian.  Tubal.  Peritoneal cancers.  Results of the assessment will determine the need for genetic counseling and BRCA1 and BRCA2 testing. Cervical Cancer Your health care provider may recommend that you be screened regularly for cancer of the pelvic organs (ovaries, uterus, and  vagina). This screening involves a pelvic examination, including checking for microscopic changes to the surface of your cervix (Pap test). You may be encouraged to have this screening done every 3 years, beginning at age 21.  For women ages 30-65, health care providers may recommend pelvic exams and Pap testing every 3 years, or they may recommend the Pap and pelvic exam, combined with testing for human papilloma virus (HPV), every 5 years. Some types of HPV increase your risk of cervical cancer. Testing for HPV may also be done on women of any age with unclear Pap test results.  Other health care providers may not recommend any screening for nonpregnant women who are considered low risk for pelvic cancer and who do not have symptoms. Ask your health care provider if a screening pelvic exam is right for you.  If you have had past treatment for cervical cancer or a condition that could lead to cancer, you need Pap tests and screening for cancer for at least 20 years after your treatment. If Pap tests have been discontinued, your risk factors (such as having a new sexual partner) need to be reassessed to determine if screening should resume. Some women have medical problems that increase the chance of getting cervical cancer. In these cases, your health care provider may recommend more frequent screening and Pap tests. Colorectal Cancer  This type of cancer can be detected and often prevented.  Routine colorectal cancer screening usually begins at 29 years of age and continues through 29 years of age.  Your health care provider may recommend screening at an earlier age if you have risk factors for colon cancer.  Your health care provider may also recommend using home test kits to check for hidden blood in the stool.  A small camera at the end of a tube can be used to examine your colon directly (sigmoidoscopy or colonoscopy). This is done to check for the earliest forms of colorectal  cancer.  Routine screening usually begins at age 50.  Direct examination of the colon should be repeated every 5-10 years through 29 years of age. However, you may need to be screened more often if early forms of precancerous polyps or small growths are found. Skin Cancer  Check your skin from head to toe regularly.  Tell your health care provider about any new moles or changes in moles, especially if there is a change in a mole's shape or color.  Also tell your health care provider if you have a mole that is larger than the size of a pencil eraser.  Always use sunscreen. Apply sunscreen liberally and repeatedly throughout the day.  Protect yourself by wearing long sleeves, pants, a wide-brimmed hat, and sunglasses whenever you are outside. HEART DISEASE, DIABETES, AND HIGH BLOOD PRESSURE   High blood pressure causes heart disease and increases the risk of stroke. High blood pressure is more likely to develop in:  People who have blood pressure in the high end   of the normal range (130-139/85-89 mm Hg).  People who are overweight or obese.  People who are African American.  If you are 38-23 years of age, have your blood pressure checked every 3-5 years. If you are 61 years of age or older, have your blood pressure checked every year. You should have your blood pressure measured twice--once when you are at a hospital or clinic, and once when you are not at a hospital or clinic. Record the average of the two measurements. To check your blood pressure when you are not at a hospital or clinic, you can use:  An automated blood pressure machine at a pharmacy.  A home blood pressure monitor.  If you are between 45 years and 39 years old, ask your health care provider if you should take aspirin to prevent strokes.  Have regular diabetes screenings. This involves taking a blood sample to check your fasting blood sugar level.  If you are at a normal weight and have a low risk for diabetes,  have this test once every three years after 29 years of age.  If you are overweight and have a high risk for diabetes, consider being tested at a younger age or more often. PREVENTING INFECTION  Hepatitis B  If you have a higher risk for hepatitis B, you should be screened for this virus. You are considered at high risk for hepatitis B if:  You were born in a country where hepatitis B is common. Ask your health care provider which countries are considered high risk.  Your parents were born in a high-risk country, and you have not been immunized against hepatitis B (hepatitis B vaccine).  You have HIV or AIDS.  You use needles to inject street drugs.  You live with someone who has hepatitis B.  You have had sex with someone who has hepatitis B.  You get hemodialysis treatment.  You take certain medicines for conditions, including cancer, organ transplantation, and autoimmune conditions. Hepatitis C  Blood testing is recommended for:  Everyone born from [birth year] through 1965.  Anyone with known risk factors for hepatitis C. Sexually transmitted infections (STIs)  You should be screened for sexually transmitted infections (STIs) including gonorrhea and chlamydia if:  You are sexually active and are younger than 29 years of age.  You are older than 29 years of age and your health care provider tells you that you are at risk for this type of infection.  Your sexual activity has changed since you were last screened and you are at an increased risk for chlamydia or gonorrhea. Ask your health care provider if you are at risk.  If you do not have HIV, but are at risk, it may be recommended that you take a prescription medicine daily to prevent HIV infection. This is called pre-exposure prophylaxis (PrEP). You are considered at risk if:  You are sexually active and do not regularly use condoms or know the HIV status of your partner(s).  You take drugs by injection.  You are sexually  active with a partner who has HIV. Talk with your health care provider about whether you are at high risk of being infected with HIV. If you choose to begin PrEP, you should first be tested for HIV. You should then be tested every 3 months for as long as you are taking PrEP.  PREGNANCY   If you are premenopausal and you may become pregnant, ask your health care provider about preconception counseling.  If you may  become pregnant, take 400 to 800 micrograms (mcg) of folic acid every day.  If you want to prevent pregnancy, talk to your health care provider about birth control (contraception). OSTEOPOROSIS AND MENOPAUSE   Osteoporosis is a disease in which the bones lose minerals and strength with aging. This can result in serious bone fractures. Your risk for osteoporosis can be identified using a bone density scan.  If you are 61 years of age or older, or if you are at risk for osteoporosis and fractures, ask your health care provider if you should be screened.  Ask your health care provider whether you should take a calcium or vitamin D supplement to lower your risk for osteoporosis.  Menopause may have certain physical symptoms and risks.  Hormone replacement therapy may reduce some of these symptoms and risks. Talk to your health care provider about whether hormone replacement therapy is right for you.  HOME CARE INSTRUCTIONS   Schedule regular health, dental, and eye exams.  Stay current with your immunizations.   Do not use any tobacco products including cigarettes, chewing tobacco, or electronic cigarettes.  If you are pregnant, do not drink alcohol.  If you are breastfeeding, limit how much and how often you drink alcohol.  Limit alcohol intake to no more than 1 drink per day for nonpregnant women. One drink equals 12 ounces of beer, 5 ounces of wine, or 1 ounces of hard liquor.  Do not use street drugs.  Do not share needles.  Ask your health care provider for help if  you need support or information about quitting drugs.  Tell your health care provider if you often feel depressed.  Tell your health care provider if you have ever been abused or do not feel safe at home.   This information is not intended to replace advice given to you by your health care provider. Make sure you discuss any questions you have with your health care provider.   Document Released: 07/05/2010 Document Revised: 01/10/2014 Document Reviewed: 11/21/2012 Elsevier Interactive Patient Education [employer].

## 2014-12-23 NOTE — Addendum Note (Signed)
Addended by: Prescott GumLAND, Frederico Gerling M on: 12/23/2014 11:35 AM   Modules accepted: Kipp BroodSmartSet

## 2014-12-24 LAB — CMP14+EGFR
A/G RATIO: 1.8 (ref 1.1–2.5)
ALT: 13 IU/L (ref 0–32)
AST: 12 IU/L (ref 0–40)
Albumin: 4.6 g/dL (ref 3.5–5.5)
Alkaline Phosphatase: 59 IU/L (ref 39–117)
BUN/Creatinine Ratio: 15 (ref 8–20)
BUN: 10 mg/dL (ref 6–20)
Bilirubin Total: 0.5 mg/dL (ref 0.0–1.2)
CALCIUM: 9.2 mg/dL (ref 8.7–10.2)
CO2: 25 mmol/L (ref 18–29)
CREATININE: 0.67 mg/dL (ref 0.57–1.00)
Chloride: 99 mmol/L (ref 96–106)
GFR, EST AFRICAN AMERICAN: 137 mL/min/{1.73_m2} (ref >59)
GFR, EST NON AFRICAN AMERICAN: 119 mL/min/{1.73_m2} (ref >59)
GLOBULIN, TOTAL: 2.5 g/dL (ref 1.5–4.5)
Glucose: 85 mg/dL (ref 65–99)
POTASSIUM: 4.2 mmol/L (ref 3.5–5.2)
SODIUM: 139 mmol/L (ref 134–144)
TOTAL PROTEIN: 7.1 g/dL (ref 6.0–8.5)

## 2014-12-24 LAB — CBC WITH DIFFERENTIAL/PLATELET
BASOS ABS: 0 10*3/uL (ref 0.0–0.2)
Basos: 0 %
EOS (ABSOLUTE): 0.2 10*3/uL (ref 0.0–0.4)
Eos: 3 %
HEMOGLOBIN: 14.1 g/dL (ref 11.1–15.9)
Hematocrit: 41.3 % (ref 34.0–46.6)
Immature Grans (Abs): 0 10*3/uL (ref 0.0–0.1)
Immature Granulocytes: 0 %
LYMPHS ABS: 2 10*3/uL (ref 0.7–3.1)
Lymphs: 33 %
MCH: 31.5 pg (ref 26.6–33.0)
MCHC: 34.1 g/dL (ref 31.5–35.7)
MCV: 92 fL (ref 79–97)
MONOCYTES: 7 %
Monocytes Absolute: 0.4 10*3/uL (ref 0.1–0.9)
NEUTROS ABS: 3.5 10*3/uL (ref 1.4–7.0)
Neutrophils: 57 %
PLATELETS: 234 10*3/uL (ref 150–379)
RBC: 4.48 x10E6/uL (ref 3.77–5.28)
RDW: 13.2 % (ref 12.3–15.4)
WBC: 6.1 10*3/uL (ref 3.4–10.8)

## 2014-12-24 LAB — THYROID PANEL WITH TSH
FREE THYROXINE INDEX: 2.2 (ref 1.2–4.9)
T3 Uptake Ratio: 29 % (ref 24–39)
T4, Total: 7.5 ug/dL (ref 4.5–12.0)
TSH: 0.882 u[IU]/mL (ref 0.450–4.500)

## 2014-12-24 LAB — LIPID PANEL
CHOL/HDL RATIO: 2.2 ratio (ref 0.0–4.4)
Cholesterol, Total: 155 mg/dL (ref 100–199)
HDL: 70 mg/dL (ref >39)
LDL CALC: 64 mg/dL (ref 0–99)
TRIGLYCERIDES: 106 mg/dL (ref 0–149)
VLDL Cholesterol Cal: 21 mg/dL (ref 5–40)

## 2014-12-25 LAB — PAP IG, CT-NG, RFX HPV ASCU
CHLAMYDIA, NUC. ACID AMP: NEGATIVE
Gonococcus by Nucleic Acid Amp: NEGATIVE
PAP Smear Comment: 0

## 2015-08-24 ENCOUNTER — Telehealth: Payer: Self-pay | Admitting: Nurse Practitioner

## 2015-08-24 ENCOUNTER — Other Ambulatory Visit: Payer: Self-pay | Admitting: Nurse Practitioner

## 2015-08-24 MED ORDER — PREDNISONE 10 MG (21) PO TBPK: ORAL_TABLET | ORAL | 0 refills | Status: DC

## 2015-08-24 NOTE — Telephone Encounter (Signed)
rx sent to pharmacy

## 2015-08-24 NOTE — Telephone Encounter (Signed)
Please review and advise.

## 2015-08-24 NOTE — Telephone Encounter (Signed)
Pt aware Rx was sent in

## 2016-11-07 ENCOUNTER — Encounter: Payer: 59 | Admitting: Nurse Practitioner

## 2016-11-08 ENCOUNTER — Encounter: Payer: Self-pay | Admitting: Nurse Practitioner

## 2016-12-24 ENCOUNTER — Emergency Department (HOSPITAL_BASED_OUTPATIENT_CLINIC_OR_DEPARTMENT_OTHER)
Admission: EM | Admit: 2016-12-24 | Discharge: 2016-12-24 | Disposition: A | Discharge status: Home / Self Care | Payer: 59 | Attending: Emergency Medicine | Admitting: Emergency Medicine

## 2016-12-24 ENCOUNTER — Encounter (HOSPITAL_BASED_OUTPATIENT_CLINIC_OR_DEPARTMENT_OTHER): Payer: Self-pay | Admitting: *Deleted

## 2016-12-24 ENCOUNTER — Emergency Department (HOSPITAL_BASED_OUTPATIENT_CLINIC_OR_DEPARTMENT_OTHER): Payer: 59

## 2016-12-24 ENCOUNTER — Other Ambulatory Visit: Payer: Self-pay

## 2016-12-24 DIAGNOSIS — Y999 Unspecified external cause status: Secondary | ICD-10-CM | POA: Insufficient documentation

## 2016-12-24 DIAGNOSIS — S0181XA Laceration without foreign body of other part of head, initial encounter: Secondary | ICD-10-CM

## 2016-12-24 DIAGNOSIS — F17200 Nicotine dependence, unspecified, uncomplicated: Secondary | ICD-10-CM | POA: Diagnosis not present

## 2016-12-24 DIAGNOSIS — S01111A Laceration without foreign body of right eyelid and periocular area, initial encounter: Secondary | ICD-10-CM | POA: Insufficient documentation

## 2016-12-24 DIAGNOSIS — S199XXA Unspecified injury of neck, initial encounter: Secondary | ICD-10-CM | POA: Diagnosis not present

## 2016-12-24 DIAGNOSIS — S0990XA Unspecified injury of head, initial encounter: Secondary | ICD-10-CM | POA: Diagnosis not present

## 2016-12-24 DIAGNOSIS — W010XXA Fall on same level from slipping, tripping and stumbling without subsequent striking against object, initial encounter: Secondary | ICD-10-CM | POA: Diagnosis not present

## 2016-12-24 DIAGNOSIS — M542 Cervicalgia: Secondary | ICD-10-CM | POA: Insufficient documentation

## 2016-12-24 DIAGNOSIS — W19XXXA Unspecified fall, initial encounter: Secondary | ICD-10-CM

## 2016-12-24 DIAGNOSIS — Y92009 Unspecified place in unspecified non-institutional (private) residence as the place of occurrence of the external cause: Secondary | ICD-10-CM | POA: Diagnosis not present

## 2016-12-24 DIAGNOSIS — Y9389 Activity, other specified: Secondary | ICD-10-CM | POA: Insufficient documentation

## 2016-12-24 DIAGNOSIS — Z23 Encounter for immunization: Secondary | ICD-10-CM | POA: Diagnosis not present

## 2016-12-24 MED ORDER — ACETAMINOPHEN 500 MG PO TABS
1000.0000 mg | ORAL_TABLET | Freq: Once | ORAL | Status: AC
  Administered 2016-12-24: 1000 mg via ORAL
  Filled 2016-12-24: qty 2

## 2016-12-24 MED ORDER — LIDOCAINE-EPINEPHRINE 2 %-1:100000 IJ SOLN
30.0000 mL | Freq: Once | INTRAMUSCULAR | Status: AC
Start: 2016-12-24 — End: 2016-12-24
  Administered 2016-12-24: 30 mL

## 2016-12-24 MED ORDER — TETANUS-DIPHTH-ACELL PERTUSSIS 5-2.5-18.5 LF-MCG/0.5 IM SUSP
0.5000 mL | Freq: Once | INTRAMUSCULAR | Status: AC
  Administered 2016-12-24: 0.5 mL via INTRAMUSCULAR
  Filled 2016-12-24: qty 0.5

## 2016-12-24 MED ORDER — LIDOCAINE-EPINEPHRINE-TETRACAINE (LET) SOLUTION
3.0000 mL | Freq: Once | NASAL | Status: AC
  Administered 2016-12-24: 3 mL via TOPICAL

## 2016-12-24 MED ORDER — LIDOCAINE-EPINEPHRINE-TETRACAINE (LET) SOLUTION
NASAL | Status: AC
  Administered 2016-12-24: 3 mL via TOPICAL
  Filled 2016-12-24: qty 3

## 2016-12-24 NOTE — ED Notes (Signed)
Dr. Palumbo at [facility] 

## 2016-12-24 NOTE — ED Notes (Signed)
To CT via stretcher, no changes, alert, NAD, calm.

## 2016-12-24 NOTE — ED Notes (Signed)
[facility]. Wound previously anesthetized with LET, surrounding skin blanched. Pt anxious about needle.

## 2016-12-24 NOTE — ED Notes (Signed)
Up to b/r, steady gait, alert, NAD, calm.

## 2016-12-24 NOTE — ED Notes (Signed)
Resting comfortably, tolerated well. Alert, NAD, calm, interactive[facility]

## 2016-12-24 NOTE — ED Triage Notes (Signed)
Here from home with family s/p fall. "was sleeping, woke to go to the b/r, tripped over large dog bone, fell and hit forehead against door jam", occurred PTA, admits to ETOH, ~1" verticle lac thru mid R eyebrow, no active bleeding, (denies: LOC, head, neck or back pain, NV, dizziness, epistaxis, malocclusion, hearing or vision changes), PERRL.   Alert, NAD, calm, interactive, resps e/u, speaking in clear complete sentences, no dyspnea noted, skin W&D. [facility]

## 2016-12-24 NOTE — ED Provider Notes (Signed)
[facility] EMERGENCY DEPARTMENT Provider Note   CSN: [id number] Arrival date & time: 12/24/16  0310     History   Chief Complaint Chief Complaint  Patient presents with  . Fall    HPI Amy Ingram is a 31 y.o. female.  The history is provided by the patient and a relative.  Fall  This is a new problem. The current episode started 1 to 2 hours ago. The problem occurs constantly. The problem has not changed since onset.Pertinent negatives include no chest pain, no abdominal pain and no shortness of breath. Nothing aggravates the symptoms. Nothing relieves the symptoms. She has tried nothing for the symptoms. The treatment provided no relief.  Had a drink or two and went to bed and awoke to use bathroom and and tripped over the dog's things and hit head on a door jam with laceration through right eyebrow.    Past Medical History:  Diagnosis Date  . Allergy     There are no active problems to display for this patient.   History reviewed. No pertinent surgical history.  OB History    No data available       Home Medications    Prior to Admission medications   Medication Sig Start Date End Date Taking? Authorizing Provider  cyclobenzaprine (FLEXERIL) 10 MG tablet Take 1 tablet (10 mg total) by mouth 3 (three) times daily as needed for muscle spasms. 12/17/13   Daphine DeutscherMartin, Mary-Margaret, FNP  imiquimod Mathis Dad(ALDARA) 5 % cream Apply topically 3 (three) times a week. Patient not taking: Reported on 12/23/2014 10/02/12   Bennie PieriniMartin, Mary-Margaret, FNP  ketoconazole (NIZORAL) 2 % cream Apply 1 application topically daily. Patient not taking: Reported on 12/23/2014 07/29/13   Deatra Canterxford, William J, FNP  meclizine (ANTIVERT) 25 MG tablet Take 1 tablet (25 mg total) by mouth 3 (three) times daily as needed for dizziness. Patient not taking: Reported on 12/23/2014 07/29/13   Deatra Canterxford, William J, FNP  predniSONE (STERAPRED UNI-PAK 21 TAB) 10 MG (21) TBPK tablet As directed x 6 days  08/24/15   Bennie PieriniMartin, Mary-Margaret, FNP    Family History History reviewed. No pertinent family history.  Social History Social History   Tobacco Use  . Smoking status: Current Every Day Smoker  . Smokeless tobacco: Never Used  Substance Use Topics  . Alcohol use: Yes  . Drug use: No     Allergies   Patient has no known allergies.   Review of Systems Review of Systems  Respiratory: Negative for shortness of breath.   Cardiovascular: Negative for chest pain.  Gastrointestinal: Negative for abdominal pain.  Musculoskeletal: Negative for neck pain.  Skin: Positive for wound.  Neurological: Negative for weakness and numbness.  All other systems reviewed and are negative.    Physical Exam Updated Vital Signs BP 105/66   Pulse 100   Temp 98.1 F (36.7 C) (Oral)   Resp 20   Ht 5' 5.5" (1.664 m)   Wt 52.3 kg (115 lb 4.8 oz)   LMP 12/23/2016 (Exact Date)   SpO2 96%   BMI 18.90 kg/m   Physical Exam  Constitutional: She appears well-developed and well-nourished. No distress.  HENT:  Head: Normocephalic. Head is without raccoon's eyes and without Battle's sign.    Nose: Nose normal.  Eyes: Conjunctivae are normal. Pupils are equal, round, and reactive to light.  Neck: Normal range of motion. Neck supple. No tracheal deviation present.  Cardiovascular: Normal rate, regular rhythm, normal heart sounds and intact distal  pulses.  Pulmonary/Chest: Effort normal and breath sounds normal. No stridor.  Abdominal: Soft. Bowel sounds are normal. There is no tenderness. There is no guarding.  Musculoskeletal: Normal range of motion.  Neurological: She displays normal reflexes.  Skin: Skin is warm and dry. Capillary refill takes less than 2 seconds.  Psychiatric: She has a normal mood and affect.  Nursing note and vitals reviewed.    ED Treatments / Results  Labs (all labs ordered are listed, but only abnormal results are displayed) Labs Reviewed - No data to  display  EKG  EKG Interpretation None       Radiology Ct Head Wo Contrast  Result Date: 12/24/2016 CLINICAL DATA:  Fall with head impact.  Neck pain. EXAM: CT HEAD WITHOUT CONTRAST CT CERVICAL SPINE WITHOUT CONTRAST TECHNIQUE: Multidetector CT imaging of the head and cervical spine was performed following the standard protocol without intravenous contrast. Multiplanar CT image reconstructions of the cervical spine were also generated. COMPARISON:  Brain MRI 05/11/2004 FINDINGS: CT HEAD FINDINGS Brain: No mass lesion, intraparenchymal hemorrhage or extra-axial collection. No evidence of acute cortical infarct. Brain parenchyma and CSF-containing spaces are normal for age. Vascular: No hyperdense vessel or unexpected calcification. Skull: Small right frontal scalp laceration without skull fracture. Sinuses/Orbits: No sinus fluid levels or advanced mucosal thickening. No mastoid effusion. Normal orbits. CT CERVICAL SPINE FINDINGS Alignment: No static subluxation. Facets are aligned. Occipital condyles are normally positioned. Skull base and vertebrae: No acute fracture. Soft tissues and spinal canal: No prevertebral fluid or swelling. No visible canal hematoma. Disc levels: No advanced spinal canal or neural foraminal stenosis. Upper chest: No pneumothorax, pulmonary nodule or pleural effusion. Other: Normal visualized paraspinal cervical soft tissues. IMPRESSION: 1. Small right frontal periorbital scalp laceration. 2. Otherwise normal CT of the head and cervical spine. Electronically Signed   By: Deatra Robinson M.D.   On: 12/24/2016 04:31   Ct Cervical Spine Wo Contrast  Result Date: 12/24/2016 CLINICAL DATA:  Fall with head impact.  Neck pain. EXAM: CT HEAD WITHOUT CONTRAST CT CERVICAL SPINE WITHOUT CONTRAST TECHNIQUE: Multidetector CT imaging of the head and cervical spine was performed following the standard protocol without intravenous contrast. Multiplanar CT image reconstructions of the cervical  spine were also generated. COMPARISON:  Brain MRI 05/11/2004 FINDINGS: CT HEAD FINDINGS Brain: No mass lesion, intraparenchymal hemorrhage or extra-axial collection. No evidence of acute cortical infarct. Brain parenchyma and CSF-containing spaces are normal for age. Vascular: No hyperdense vessel or unexpected calcification. Skull: Small right frontal scalp laceration without skull fracture. Sinuses/Orbits: No sinus fluid levels or advanced mucosal thickening. No mastoid effusion. Normal orbits. CT CERVICAL SPINE FINDINGS Alignment: No static subluxation. Facets are aligned. Occipital condyles are normally positioned. Skull base and vertebrae: No acute fracture. Soft tissues and spinal canal: No prevertebral fluid or swelling. No visible canal hematoma. Disc levels: No advanced spinal canal or neural foraminal stenosis. Upper chest: No pneumothorax, pulmonary nodule or pleural effusion. Other: Normal visualized paraspinal cervical soft tissues. IMPRESSION: 1. Small right frontal periorbital scalp laceration. 2. Otherwise normal CT of the head and cervical spine. Electronically Signed   By: Deatra Robinson M.D.   On: 12/24/2016 04:31    Procedures .Marland KitchenLaceration Repair Date/Time: 12/24/2016 5:41 AM Performed by: Cy Blamer, MD Authorized by: Cy Blamer, MD   Consent:    Consent obtained:  Verbal   Consent given by:  Patient   Risks discussed:  Infection, need for additional repair, pain and poor cosmetic result   Alternatives discussed:  No treatment Anesthesia (see MAR for exact dosages):    Anesthesia method:  Topical application and local infiltration   Topical anesthetic:  LET   Local anesthetic:  Lidocaine 2% WITH epi Laceration details:    Location:  Face   Face location:  R eyebrow   Length (cm):  1.5   Depth (mm):  2 Repair type:    Repair type:  Simple Pre-procedure details:    Preparation:  Patient was prepped and draped in usual sterile fashion Exploration:    Hemostasis  achieved with:  Direct pressure   Wound exploration: wound explored through full range of motion     Wound extent: no areolar tissue violation noted, no foreign bodies/material noted and no nerve damage noted     Contaminated: no   Treatment:    Area cleansed with:  Saline and Betadine   Amount of cleaning:  Extensive   Irrigation solution:  Sterile saline   Irrigation method:  Syringe Skin repair:    Repair method:  Sutures   Suture size:  5-0   Suture material:  Prolene   Suture technique:  Simple interrupted   Number of sutures:  4 Approximation:    Approximation:  Close   Vermilion border: well-aligned   Post-procedure details:    Dressing:  Sterile dressing   Patient tolerance of procedure:  Tolerated well, no immediate complications   (including critical care time)  Medications Ordered in ED Medications  lidocaine-EPINEPHrine-tetracaine (LET) solution (3 mLs Topical Given 12/24/16 0335)  acetaminophen (TYLENOL) tablet 1,000 mg (1,000 mg Oral Given 12/24/16 0355)  Tdap (BOOSTRIX) injection 0.5 mL (0.5 mLs Intramuscular Given 12/24/16 0413)  lidocaine-EPINEPHrine (XYLOCAINE W/EPI) 2 %-1:100000 (with pres) injection 30 mL (30 mLs Infiltration Given by Other 12/24/16 0524)       Final Clinical Impressions(s) / ED Diagnoses    Urgent care for suture removal in 5 to 6 days.  Return for dehiscence, purulent drainage from wound, fevers > 100.4 unrelieved by medication, intractable vomiting, or diarrhea, abdominal pain, Inability to tolerate liquids or food, cough, altered mental status or any concerns. No signs of systemic illness or infection. The patient is nontoxic-appearing on exam and vital signs are within normal limits.    I have reviewed the triage vital signs and the nursing notes. Pertinent labs &imaging results that were available during my care of the patient were reviewed by me and considered in my medical decision making (see chart for details).  After  history, exam, and medical workup I feel the patient has been appropriately medically screened and is safe for discharge home. Pertinent diagnoses were discussed with the patient. Patient was given return precautions      Adair Lemar, MD 12/24/16 [phone]

## 2017-05-26 ENCOUNTER — Ambulatory Visit (INDEPENDENT_AMBULATORY_CARE_PROVIDER_SITE_OTHER): Payer: 59 | Admitting: Nurse Practitioner

## 2017-05-26 ENCOUNTER — Encounter: Payer: Self-pay | Admitting: Nurse Practitioner

## 2017-05-26 VITALS — BP 119/82 | HR 83 | Temp 98.1°F | Ht 65.0 in | Wt 108.0 lb

## 2017-05-26 DIAGNOSIS — Z01419 Encounter for gynecological examination (general) (routine) without abnormal findings: Secondary | ICD-10-CM | POA: Diagnosis not present

## 2017-05-26 DIAGNOSIS — Z0000 Encounter for general adult medical examination without abnormal findings: Secondary | ICD-10-CM | POA: Diagnosis not present

## 2017-05-26 LAB — URINALYSIS, COMPLETE
BILIRUBIN UA: NEGATIVE
GLUCOSE, UA: NEGATIVE
KETONES UA: NEGATIVE
Leukocytes, UA: NEGATIVE
Nitrite, UA: NEGATIVE
PH UA: 6 (ref 5.0–7.5)
PROTEIN UA: NEGATIVE
SPEC GRAV UA: 1.025 (ref 1.005–1.030)
UUROB: 0.2 mg/dL (ref 0.2–1.0)

## 2017-05-26 LAB — MICROSCOPIC EXAMINATION
Epithelial Cells (non renal): >10 /hpf — AB (ref 0–10)
Renal Epithel, UA: NONE SEEN /hpf

## 2017-05-26 NOTE — Patient Instructions (Signed)

## 2017-05-26 NOTE — Progress Notes (Signed)
   Subjective:    Patient ID: Amy Ingram, female    DOB: [birth year], 32 y.o.   MRN: [id number]   Chief Complaint: Annual Exam   HPI Patient come sin today for annual physical exam. She is doing well. Has no chronic medical problems and is on no meds.  Review of Systems  Constitutional: Negative for activity change and appetite change.  HENT: Negative.   Eyes: Negative for pain.  Respiratory: Negative for shortness of breath.   Cardiovascular: Negative for chest pain, palpitations and leg swelling.  Gastrointestinal: Negative for abdominal pain.  Endocrine: Negative for polydipsia.  Genitourinary: Negative.   Skin: Negative for rash.  Neurological: Negative for dizziness, weakness and headaches.  Hematological: Does not bruise/bleed easily.  Psychiatric/Behavioral: Negative.   All other systems reviewed and are negative.      Objective:   Physical Exam  Constitutional: She is oriented to person, place, and time.  HENT:  Head: Normocephalic.  Nose: Nose normal.  Mouth/Throat: Oropharynx is clear and moist.  Eyes: Pupils are equal, round, and reactive to light. EOM are normal.  Neck: Normal range of motion. Neck supple. No JVD present. Carotid bruit is not present.  Cardiovascular: Normal rate, regular rhythm, normal heart sounds and intact distal pulses.  Pulmonary/Chest: Effort normal and breath sounds normal. No respiratory distress. She has no wheezes. She has no rales. She exhibits no tenderness.  Abdominal: Soft. Normal appearance, normal aorta and bowel sounds are normal. She exhibits no distension, no abdominal bruit, no pulsatile midline mass and no mass. There is no splenomegaly or hepatomegaly. There is no tenderness.  Genitourinary: Vagina normal and uterus normal. No vaginal discharge found.  Musculoskeletal: Normal range of motion. She exhibits no edema.  Lymphadenopathy:    She has no cervical adenopathy.  Neurological: She is alert and oriented to person,  place, and time. She has normal reflexes.  Skin: Skin is warm and dry.  Psychiatric: She has a normal mood and affect. Her behavior is normal. Judgment and thought content normal.  Nursing note and vitals reviewed.   BP 119/82   Pulse 83   Temp 98.1 F (36.7 C) (Oral)   Ht _0  (1.651 m)   Wt 108 lb (49 kg)   BMI 17.97 kg/m        Assessment & Plan:  Amy Ingram in today with chief complaint of Annual Exam   1. Annual physical exam - CMP14+EGFR - Lipid panel - CBC with Differential/Platelet - Thyroid Panel With TSH - Urinalysis, Complete  2. Gynecologic exam normal - IGP, Aptima HPV, rfx 16/18,45  Labs pending Exercise encouraged RTO prn  Mary-Margaret Hassell Done, FNP

## 2017-05-27 LAB — LIPID PANEL
Chol/HDL Ratio: 2.3 ratio (ref 0.0–4.4)
Cholesterol, Total: 135 mg/dL (ref 100–199)
HDL: 59 mg/dL (ref >39)
LDL Calculated: 16 mg/dL (ref 0–99)
Triglycerides: 298 mg/dL — ABNORMAL HIGH (ref 0–149)
VLDL Cholesterol Cal: 60 mg/dL — ABNORMAL HIGH (ref 5–40)

## 2017-05-27 LAB — CMP14+EGFR
ALBUMIN: 4.9 g/dL (ref 3.5–5.5)
ALT: 10 IU/L (ref 0–32)
AST: 14 IU/L (ref 0–40)
Albumin/Globulin Ratio: 1.9 (ref 1.2–2.2)
Alkaline Phosphatase: 58 IU/L (ref 39–117)
BUN / CREAT RATIO: 20 (ref 9–23)
BUN: 14 mg/dL (ref 6–20)
Bilirubin Total: 0.4 mg/dL (ref 0.0–1.2)
CALCIUM: 9.7 mg/dL (ref 8.7–10.2)
CO2: 24 mmol/L (ref 20–29)
Chloride: 99 mmol/L (ref 96–106)
Creatinine, Ser: 0.69 mg/dL (ref 0.57–1.00)
GFR, EST AFRICAN AMERICAN: 134 mL/min/{1.73_m2} (ref >59)
GFR, EST NON AFRICAN AMERICAN: 116 mL/min/{1.73_m2} (ref >59)
GLUCOSE: 87 mg/dL (ref 65–99)
Globulin, Total: 2.6 g/dL (ref 1.5–4.5)
Potassium: 4.4 mmol/L (ref 3.5–5.2)
Sodium: 139 mmol/L (ref 134–144)
TOTAL PROTEIN: 7.5 g/dL (ref 6.0–8.5)

## 2017-05-27 LAB — CBC WITH DIFFERENTIAL/PLATELET
Basophils Absolute: 0 10*3/uL (ref 0.0–0.2)
Basos: 0 %
EOS (ABSOLUTE): 0.2 10*3/uL (ref 0.0–0.4)
Eos: 2 %
Hematocrit: 42.3 % (ref 34.0–46.6)
Hemoglobin: 14.3 g/dL (ref 11.1–15.9)
IMMATURE GRANS (ABS): 0 10*3/uL (ref 0.0–0.1)
Immature Granulocytes: 0 %
LYMPHS: 28 %
Lymphocytes Absolute: 2.6 10*3/uL (ref 0.7–3.1)
MCH: 31.4 pg (ref 26.6–33.0)
MCHC: 33.8 g/dL (ref 31.5–35.7)
MCV: 93 fL (ref 79–97)
Monocytes Absolute: 0.5 10*3/uL (ref 0.1–0.9)
Monocytes: 6 %
NEUTROS ABS: 5.7 10*3/uL (ref 1.4–7.0)
Neutrophils: 64 %
PLATELETS: 266 10*3/uL (ref 150–450)
RBC: 4.56 x10E6/uL (ref 3.77–5.28)
RDW: 13.6 % (ref 12.3–15.4)
WBC: 9.1 10*3/uL (ref 3.4–10.8)

## 2017-05-27 LAB — THYROID PANEL WITH TSH
Free Thyroxine Index: 1.7 (ref 1.2–4.9)
T3 UPTAKE RATIO: 25 % (ref 24–39)
T4, Total: 6.6 ug/dL (ref 4.5–12.0)
TSH: 0.925 u[IU]/mL (ref 0.450–4.500)

## 2017-05-31 LAB — IGP, APTIMA HPV, RFX 16/18,45
HPV Aptima: NEGATIVE
PAP SMEAR COMMENT: 0

## 2017-06-01 ENCOUNTER — Encounter (HOSPITAL_BASED_OUTPATIENT_CLINIC_OR_DEPARTMENT_OTHER): Payer: Self-pay

## 2017-06-01 ENCOUNTER — Other Ambulatory Visit: Payer: Self-pay

## 2017-06-01 DIAGNOSIS — S3992XA Unspecified injury of lower back, initial encounter: Secondary | ICD-10-CM | POA: Diagnosis present

## 2017-06-01 DIAGNOSIS — F17210 Nicotine dependence, cigarettes, uncomplicated: Secondary | ICD-10-CM | POA: Diagnosis not present

## 2017-06-01 DIAGNOSIS — Y939 Activity, unspecified: Secondary | ICD-10-CM | POA: Diagnosis not present

## 2017-06-01 DIAGNOSIS — Y929 Unspecified place or not applicable: Secondary | ICD-10-CM | POA: Diagnosis not present

## 2017-06-01 DIAGNOSIS — X500XXA Overexertion from strenuous movement or load, initial encounter: Secondary | ICD-10-CM | POA: Insufficient documentation

## 2017-06-01 DIAGNOSIS — Y999 Unspecified external cause status: Secondary | ICD-10-CM | POA: Insufficient documentation

## 2017-06-01 DIAGNOSIS — S39012A Strain of muscle, fascia and tendon of lower back, initial encounter: Secondary | ICD-10-CM | POA: Insufficient documentation

## 2017-06-01 DIAGNOSIS — Z79899 Other long term (current) drug therapy: Secondary | ICD-10-CM | POA: Diagnosis not present

## 2017-06-01 LAB — PREGNANCY, URINE: Preg Test, Ur: NEGATIVE

## 2017-06-01 NOTE — ED Triage Notes (Addendum)
Pt pulled her left side of her back when she was pulling up a patient, pain radiates down left leg, denies any incontinence, denies groin numbness, pt tried ibuprofen, aleve and a warm soak without relief

## 2017-06-02 ENCOUNTER — Emergency Department (HOSPITAL_BASED_OUTPATIENT_CLINIC_OR_DEPARTMENT_OTHER): Payer: PRIVATE HEALTH INSURANCE

## 2017-06-02 ENCOUNTER — Encounter (HOSPITAL_BASED_OUTPATIENT_CLINIC_OR_DEPARTMENT_OTHER): Payer: Self-pay | Admitting: Emergency Medicine

## 2017-06-02 ENCOUNTER — Emergency Department (HOSPITAL_BASED_OUTPATIENT_CLINIC_OR_DEPARTMENT_OTHER)
Admission: EM | Admit: 2017-06-02 | Discharge: 2017-06-02 | Disposition: A | Discharge status: Home / Self Care | Payer: PRIVATE HEALTH INSURANCE | Attending: Emergency Medicine | Admitting: Emergency Medicine

## 2017-06-02 DIAGNOSIS — S39012A Strain of muscle, fascia and tendon of lower back, initial encounter: Secondary | ICD-10-CM

## 2017-06-02 MED ORDER — METHOCARBAMOL 500 MG PO TABS: 500.0000 mg | ORAL_TABLET | Freq: Two times a day (BID) | ORAL | 0 refills | Status: DC

## 2017-06-02 MED ORDER — METHOCARBAMOL 500 MG PO TABS
1000.0000 mg | ORAL_TABLET | Freq: Once | ORAL | Status: AC
  Administered 2017-06-02: 1000 mg via ORAL
  Filled 2017-06-02: qty 2

## 2017-06-02 MED ORDER — PREDNISONE 20 MG PO TABS: ORAL_TABLET | ORAL | 0 refills | Status: DC

## 2017-06-02 MED ORDER — DICLOFENAC SODIUM ER 100 MG PO TB24: 100.0000 mg | ORAL_TABLET | Freq: Every day | ORAL | 0 refills | Status: DC

## 2017-06-02 MED ORDER — KETOROLAC TROMETHAMINE 60 MG/2ML IM SOLN
30.0000 mg | Freq: Once | INTRAMUSCULAR | Status: AC
  Administered 2017-06-02: 30 mg via INTRAMUSCULAR
  Filled 2017-06-02: qty 2

## 2017-06-02 MED ORDER — PREDNISONE 10 MG PO TABS
60.0000 mg | ORAL_TABLET | Freq: Once | ORAL | Status: AC
  Administered 2017-06-02: 60 mg via ORAL
  Filled 2017-06-02: qty 1

## 2017-06-02 NOTE — ED Provider Notes (Signed)
[facility] EMERGENCY DEPARTMENT Provider Note   CSN: [id number] Arrival date & time: 06/01/17  2235     History   Chief Complaint Chief Complaint  Patient presents with  . Back Pain    HPI Amy Ingram is a 32 y.o. female.  The history is provided by the patient.  Back Pain   This is a new problem. The current episode started 6 to 12 hours ago. The problem occurs constantly. The problem has been rapidly worsening. The pain is associated with lifting heavy objects. The pain is present in the lumbar spine. The quality of the pain is described as stabbing. The pain does not radiate. The pain is moderate. The symptoms are aggravated by bending and twisting. The pain is the same all the time. Pertinent negatives include no chest pain, no fever, no numbness, no weight loss, no headaches, no abdominal pain, no abdominal swelling, no bowel incontinence, no perianal numbness, no bladder incontinence, no dysuria, no pelvic pain, no leg pain, no paresthesias, no paresis, no tingling and no weakness. She has tried ice for the symptoms. The treatment provided no relief. Risk factors: none.    Past Medical History:  Diagnosis Date  . Allergy     There are no active problems to display for this patient.   History reviewed. No pertinent surgical history.   OB History   None      Home Medications    Prior to Admission medications   Medication Sig Start Date End Date Taking? Authorizing Provider  cyclobenzaprine (FLEXERIL) 10 MG tablet Take 1 tablet (10 mg total) by mouth 3 (three) times daily as needed for muscle spasms. 12/17/13   Daphine Deutscher, Mary-Margaret, FNP  Diclofenac Sodium CR (VOLTAREN-XR) 100 MG 24 hr tablet Take 1 tablet (100 mg total) by mouth daily. 06/02/17   Winifred Bodiford, MD  meclizine (ANTIVERT) 25 MG tablet Take 1 tablet (25 mg total) by mouth 3 (three) times daily as needed for dizziness. 07/29/13   Deatra Canter, FNP  methocarbamol (ROBAXIN) 500 MG tablet  Take 1 tablet (500 mg total) by mouth 2 (two) times daily. 06/02/17   Heraclio Seidman, MD  predniSONE (DELTASONE) 20 MG tablet 3 tabs po day one, then 2 po daily x 4 days 06/02/17   Nicanor Alcon, Mikias Lanz, MD    Family History No family history on file.  Social History Social History   Tobacco Use  . Smoking status: Current Every Day Smoker  . Smokeless tobacco: Never Used  Substance Use Topics  . Alcohol use: Yes  . Drug use: No     Allergies   Patient has no known allergies.   Review of Systems Review of Systems  Constitutional: Negative for fever and weight loss.  Respiratory: Negative for shortness of breath.   Cardiovascular: Negative for chest pain.  Gastrointestinal: Negative for abdominal pain and bowel incontinence.  Genitourinary: Negative for bladder incontinence, difficulty urinating, dysuria and pelvic pain.  Musculoskeletal: Positive for back pain.  Neurological: Negative for tingling, weakness, numbness, headaches and paresthesias.  All other systems reviewed and are negative.    Physical Exam Updated Vital Signs BP (!) 153/97 (BP Location: Left Arm)   Pulse 95   Temp 98.5 F (36.9 C) (Oral)   Resp 18   Ht 5\' 5"  (1.651 m)   Wt 49 kg (108 lb)   LMP 05/05/2017 Comment: (-) urine pregnancy test//a.c.  SpO2 100%   BMI 17.97 kg/m   Physical Exam  Constitutional: She is  oriented to person, place, and time. She appears well-developed and well-nourished. No distress.  HENT:  Head: Normocephalic and atraumatic.  Mouth/Throat: No oropharyngeal exudate.  Eyes: Pupils are equal, round, and reactive to light. Conjunctivae are normal.  Neck: Normal range of motion. Neck supple.  Cardiovascular: Normal rate, regular rhythm, normal heart sounds and intact distal pulses.  Pulmonary/Chest: Effort normal and breath sounds normal. No stridor. She has no wheezes. She has no rales.  Abdominal: Soft. Bowel sounds are normal. She exhibits no mass. There is no tenderness. There  is no rebound and no guarding.  Musculoskeletal: Normal range of motion.  Neurological: She is alert and oriented to person, place, and time.  Skin: Skin is warm and dry. Capillary refill takes less than 2 seconds.  Psychiatric: She has a normal mood and affect.     ED Treatments / Results  Labs (all labs ordered are listed, but only abnormal results are displayed) Results for orders placed or performed during the hospital encounter of 06/02/17  Pregnancy, urine  Result Value Ref Range   Preg Test, Ur NEGATIVE NEGATIVE   Dg Lumbar Spine Complete  Result Date: 06/02/2017 CLINICAL DATA:  32 y/o F; left flank and back pain after lifting a patient. EXAM: LUMBAR SPINE - COMPLETE 4+ VIEW COMPARISON:  None. FINDINGS: There is no evidence of lumbar spine fracture. Alignment is normal. Intervertebral disc spaces are maintained. IMPRESSION: Negative. Electronically Signed   By: Mitzi Hansen M.D.   On: 06/02/2017 01:55    EKG None  Radiology Dg Lumbar Spine Complete  Result Date: 06/02/2017 CLINICAL DATA:  32 y/o F; left flank and back pain after lifting a patient. EXAM: LUMBAR SPINE - COMPLETE 4+ VIEW COMPARISON:  None. FINDINGS: There is no evidence of lumbar spine fracture. Alignment is normal. Intervertebral disc spaces are maintained. IMPRESSION: Negative. Electronically Signed   By: Mitzi Hansen M.D.   On: 06/02/2017 01:55    Procedures Procedures (including critical care time)  Medications Ordered in ED Medications  ketorolac (TORADOL) injection 30 mg (30 mg Intramuscular Given 06/02/17 0209)  methocarbamol (ROBAXIN) tablet 1,000 mg (1,000 mg Oral Given 06/02/17 0210)  predniSONE (DELTASONE) tablet 60 mg (60 mg Oral Given 06/02/17 0210)      Final Clinical Impressions(s) / ED Diagnoses   Final diagnoses:  Strain of lumbar region, initial encounter   Return for weakness, numbness, changes in vision or speech, fevers >100.4 unrelieved by medication,  shortness of breath, intractable vomiting, or diarrhea, abdominal pain, Inability to tolerate liquids or food, cough, altered mental status or any concerns. No signs of systemic illness or infection. The patient is nontoxic-appearing on exam and vital signs are within normal limits.   I have reviewed the triage vital signs and the nursing notes. Pertinent labs &imaging results that were available during my care of the patient were reviewed by me and considered in my medical decision making (see chart for details).  After history, exam, and medical workup I feel the patient has been appropriately medically screened and is safe for discharge home. Pertinent diagnoses were discussed with the patient. Patient was given return precautions.    ED Discharge Orders        Ordered    predniSONE (DELTASONE) 20 MG tablet     06/02/17 0238    methocarbamol (ROBAXIN) 500 MG tablet  2 times daily     06/02/17 0238    Diclofenac Sodium CR (VOLTAREN-XR) 100 MG 24 hr tablet  Daily  06/02/17 0238       Javiel Canepa, MD 06/02/17 [id number]

## 2017-06-02 NOTE — ED Notes (Signed)
Pt verbalizes understanding of d/c instructions and denies any further needs at this time. 

## 2017-06-08 ENCOUNTER — Encounter: Payer: Self-pay | Admitting: *Deleted

## 2017-07-10 ENCOUNTER — Ambulatory Visit: Payer: PRIVATE HEALTH INSURANCE | Attending: Family Medicine | Admitting: Physical Therapy

## 2017-07-10 ENCOUNTER — Encounter: Payer: Self-pay | Admitting: Physical Therapy

## 2017-07-10 ENCOUNTER — Other Ambulatory Visit: Payer: Self-pay

## 2017-07-10 DIAGNOSIS — M5442 Lumbago with sciatica, left side: Secondary | ICD-10-CM | POA: Diagnosis present

## 2017-07-10 DIAGNOSIS — M6281 Muscle weakness (generalized): Secondary | ICD-10-CM | POA: Diagnosis present

## 2017-07-10 DIAGNOSIS — R252 Cramp and spasm: Secondary | ICD-10-CM

## 2017-07-10 NOTE — Patient Instructions (Signed)
   BRIDGING  While lying on your back with knees bent, tighten your lower abdominals, squeeze your buttocks and then raise your buttocks off the floor/bed as creating a "Bridge" with your body.   Repeat 10-15, twice a day.    Transverse Abdominus Activation  Lying on your back, pull your bellybutton into your spine.   It should feel like you are tensing your stomach muscles.   Repeat 20 times, 3-5 times per day.     PRONE ON ELBOWS - POE  Lying face down, slowly press up and prop yourself up on your elbows, then lower back down.  Repeat 10-15 times, twice a day.     Prone Press ups ([employer] Extensions)  Lie flat on your stomach with your arms in push up position. Press up to the point of restriction and down again. Your therapist may want you to move your legs to the left or right before you press up.   Repeat 10-15 times, twice a day.     Seated Posture with Lumbar Roll  Place the lumbar roll as shown in the picture on the left.  Slide your bottom to the back of the chair and sit up straight so the roll provides support in the natural curve of your lower back.  Keep your shoulders back and head in a neutral position.  Maintain this position at all time when sitting.

## 2017-07-10 NOTE — Therapy (Signed)
[facility] Outpatient Rehabilitation [facility] [address] [place], [place], [address] Phone: [phone]   Fax:  [phone]  Physical Therapy Evaluation  Patient Details  Name: Amy Ingram MRN: [id number] Date of Birth: 11/29/1985 Referring Provider: Lanell Persons    Encounter Date: 07/10/2017  PT End of Session - 07/10/17 1711    Visit Number  1    Number of Visits  6    Date for PT Re-Evaluation  08/07/17    Authorization Type  Workers comp (6 visits approved)    Authorization Time Period  07/10/17 to 08/10/17    Authorization - Visit Number  1    Authorization - Number of Visits  6    PT Start Time  1630    PT Stop Time  1705    PT Time Calculation (min)  35 min    Activity Tolerance  Patient tolerated treatment well    Behavior During Therapy  [facility] for tasks assessed/performed       Past Medical History:  Diagnosis Date  . Allergy     History reviewed. No pertinent surgical history.  There were no vitals filed for this visit.   Subjective Assessment - 07/10/17 1631    Subjective  I injured my back pulling up a patient in bed on May 30th; I went to the workers comp doctor and they gave me some exercises which has made my pain radiate all the way down to my foot. I use a heating pad, heat helps more than ice. When I'm getting ready in the morning litlte things can hurt my back.     How long can you sit comfortably?  at worst, 5-10 minutes and has to change position    How long can you stand comfortably?  at worst, 5-10 minutes     How long can you walk comfortably?  sometimes, not consistent problems     Patient Stated Goals  get rid of pain     Currently in Pain?  No/denies 7-8/10 at worst          [facility] PT Assessment - 07/10/17 0001      Assessment   Medical Diagnosis  LBP     Referring Provider  Lanell Persons     Onset Date/Surgical Date  06/01/17    Next MD Visit  Dr. Theresia Lo after PT     Prior Therapy  none       Precautions    Precautions  Other (comment)    Precaution Comments  no lifting more than 15#, limit bending, twisting       Restrictions   Weight Bearing Restrictions  No      Balance Screen   Has the patient fallen in the past 6 months  No    Has the patient had a decrease in activity level because of a fear of falling?   No    Is the patient reluctant to leave their home because of a fear of falling?   No      Prior Function   Level of Independence  Independent;Independent with basic ADLs;Independent with gait;Independent with transfers    Vocation  Full time employment    Development worker, community    Leisure  going to gym      ROM / Strength   AROM / PROM / Strength  AROM;Strength      AROM   AROM Assessment Site  Lumbar    Lumbar Flexion  moderate, immediate peripheralization  Lumbar Extension  WNL; no change     Lumbar - Right Side Bend  WNL     Lumbar - Left Side Bend  WNL       Strength   Strength Assessment Site  Hip;Knee    Right/Left Hip  Left;Right    Right Hip Flexion  5/5    Right Hip Extension  5/5    Right Hip ABduction  4+/5    Left Hip Flexion  5/5    Left Hip Extension  4/5    Left Hip ABduction  4/5 painful     Right/Left Knee  Right;Left    Right Knee Flexion  5/5    Right Knee Extension  5/5    Left Knee Flexion  5/5    Left Knee Extension  5/5      Flexibility   Soft Tissue Assessment /Muscle Length  yes    Hamstrings  mild limitation L     Piriformis  mild limitation L       Palpation   Palpation comment  palpable muscle knot and spasm L lumbar paraspinals level L3 down to proximal HS                 Objective measurements completed on examination: See above findings.      [facility] Adult PT Treatment/Exercise - 07/10/17 0001      Exercises   Exercises  Lumbar      Lumbar Exercises: Supine   Ab Set  10 reps;1 second    Bridge  10 reps      Lumbar Exercises: Prone   Other Prone Lumbar Exercises  POE 1x10, prone press ups 1x10               PT Education - 07/10/17 1711    Education Details  exam findings, POC, HEP, prognosis     Person(s) Educated  Patient    Methods  Explanation;Demonstration;Handout    Comprehension  Verbalized understanding;Returned demonstration       PT Short Term Goals - 07/10/17 1713      PT SHORT TERM GOAL #1   Title  Patient to be compliant with appropriate HEP, to be updated PRN     Time  1    Period  Weeks    Status  New    Target Date  07/17/17      PT SHORT TERM GOAL #2   Title  Patient to demonstrate full lumbar flexion ROM without increase in pain or peripheralization of pain in order to assist in return to full work duties     Time  4    Period  Weeks    Status  New    Target Date  08/07/17      PT SHORT TERM GOAL #3   Title  Patient to report pain as being no more than 2/10 at worst in order to improve QOL and facilitate return to regular activities     Time  4    Period  Weeks    Status  New      PT SHORT TERM GOAL #4   Title  Patient to demonstrate good functional mechanics for bed mobility, floor to waist height lifting, and patient care in order to prevent exacerbation of pain while at work     Time  4    Period  Weeks    Status  New      PT SHORT TERM GOAL #5   Title  Patient to  be educated regarding appropriate advanced activities to perform at the gym to assist in prevention of back pain reoccurrance     Time  4    Period  Weeks                Plan - 07/10/17 1712    Clinical Impression Statement  Patient arrives approximately 1.5 months following a low back injury which she sustained while trying to pull an immobile patient up in bed; she was given some exercises from her MD however this only made her pain peripheralize. Examination reveals mild deficits in flexibility and functional strength L LE, significant muscle spasm and trigger points L paraspinals and hip/LE, postural and functional mechanics deficits, and extension based movement  preference. She will benefit from skilled PT services to address pain and assist in return to optimal level of function.     History and Personal Factors relevant to plan of care:  lumbar injury 06/01/17    Clinical Presentation  Stable    Clinical Decision Making  Low    Rehab Potential  Excellent    PT Frequency  Other (comment) 1-2 times per week     PT Duration  Other (comment) 6 visits     PT Treatment/Interventions  ADLs/Self Care Home Management;Biofeedback;Cryotherapy;Electrical Stimulation;Iontophoresis 4mg /ml Dexamethasone;Moist Heat;Ultrasound;Functional mobility training;Therapeutic activities;Therapeutic exercise;Balance training;Neuromuscular re-education;Patient/family education;Manual techniques;Passive range of motion;Taping;Dry needling    PT Next Visit Plan  review HEP and goals; avoid flexion, focus on extension based program. STM.     PT Home Exercise Plan  Eval: lumbar support, bridges, TA sets, POE, prone press ups     Consulted and Agree with Plan of Care  Patient       Patient will benefit from skilled therapeutic intervention in order to improve the following deficits and impairments:  Increased fascial restricitons, Improper body mechanics, Pain, Increased muscle spasms, Postural dysfunction, Decreased range of motion, Decreased strength, Hypomobility, Impaired flexibility  Visit Diagnosis: Acute left-sided low back pain with left-sided sciatica - Plan: PT plan of care cert/re-cert  Muscle weakness (generalized) - Plan: PT plan of care cert/re-cert  Cramp and spasm - Plan: PT plan of care cert/re-cert     Problem List There are no active problems to display for this patient.   Nedra HaiKristen Unger PT, DPT, CBIS  Supplemental Physical Therapist [facility]   Pager [phone]   [facility] [address] [place], [place], [address] Phone: [phone]   Fax:  [phone]  Name: Amy Ingram MRN:  [id number] Date of Birth: [birth year]

## 2017-07-12 ENCOUNTER — Ambulatory Visit: Payer: PRIVATE HEALTH INSURANCE | Attending: Nurse Practitioner | Admitting: Physical Therapy

## 2017-07-12 ENCOUNTER — Telehealth: Payer: Self-pay | Admitting: Physical Therapy

## 2017-07-12 NOTE — Telephone Encounter (Signed)
Called patient about missed visit.  She was unaware of today's appointment.  She thought she did now have PT until next week.  She was reminded of the date and time of her next apppointment.  She agreed to attend.  Liz BeachKaren  Kingson Lohmeyer PTA

## 2017-07-18 ENCOUNTER — Ambulatory Visit: Payer: PRIVATE HEALTH INSURANCE | Attending: Family Medicine | Admitting: Physical Therapy

## 2017-07-18 DIAGNOSIS — R252 Cramp and spasm: Secondary | ICD-10-CM | POA: Insufficient documentation

## 2017-07-18 DIAGNOSIS — M6281 Muscle weakness (generalized): Secondary | ICD-10-CM | POA: Insufficient documentation

## 2017-07-18 DIAGNOSIS — M5442 Lumbago with sciatica, left side: Secondary | ICD-10-CM | POA: Diagnosis not present

## 2017-07-18 NOTE — Therapy (Signed)
[facility] Outpatient Rehabilitation [facility] [address] [place], [place], [address] Phone: [phone]   Fax:  [phone]  Physical Therapy Treatment  Patient Details  Name: Amy Ingram MRN: [id number] Date of Birth: Jul 04, 1985 Referring Provider: Lanell Persons    Encounter Date: 07/18/2017  PT End of Session - 07/18/17 1022    Visit Number  2    Number of Visits  6    Date for PT Re-Evaluation  08/07/17    Authorization Type  Workers comp (6 visits approved)    Authorization Time Period  07/10/17 to 08/10/17    Authorization - Visit Number  2    Authorization - Number of Visits  6    PT Start Time  1020    PT Stop Time  1113    PT Time Calculation (min)  53 min    Activity Tolerance  Patient tolerated treatment well    Behavior During Therapy  [facility] for tasks assessed/performed       Past Medical History:  Diagnosis Date  . Allergy     No past surgical history on file.  There were no vitals filed for this visit.  Subjective Assessment - 07/18/17 1057    Subjective  when I was moving I was doing well but I was playing pool volley ball so I think I did to much    How long can you sit comfortably?  10 minutes and then change position.      How long can you stand comfortably?  Bending  over to care for patients.   5-10 minutes     Patient Stated Goals  get rid of pain     Currently in Pain?  Yes    Pain Score  5     Pain Location  Back    Pain Orientation  Left;Lower    Pain Type  Chronic pain    Pain Onset  More than a month ago    Pain Frequency  Constant intermittent nerve pain    Aggravating Factors   any movement , any raising leg and bending head and shaving in the shower                       [facility] Adult PT Treatment/Exercise - 07/18/17 1036      Self-Care   Self-Care  Other Self-Care Comments    Other Self-Care Comments   education on trigger point dry needling aftercare and precautians       Lumbar Exercises:  Stretches   Quadruped Mid Back Stretch  30 seconds;2 reps stretch right side QL    Other Lumbar Stretch Exercise  sciatic nerve glide 10 x with DF of right ankle, shown sidelying on right QL stetch with pillow under torso for rest at home      Lumbar Exercises: Supine   Ab Set  10 reps;1 second    Bridge  10 reps      Lumbar Exercises: Prone   Other Prone Lumbar Exercises  , prone press ups 1x10       Modalities   Modalities  Moist Heat      Moist Heat Therapy   Number Minutes Moist Heat  15 Minutes    Moist Heat Location  Lumbar Spine left Quadratus Lumborum      Manual Therapy   Manual Therapy  Soft tissue mobilization;Myofascial release    Manual therapy comments  skilled palpation used throughout TPDN    Soft tissue mobilization  left quadratus lumborum    Myofascial Release  left quadratus lumborum       Trigger Point Dry Needling - 07/18/17 1039    Consent Given?  Yes    Education Handout Provided  Yes    Muscles Treated Upper Body  Quadratus Lumborum;Longissimus right side only twitch    Longissimus Response  Twitch response elicited;Palpable increased muscle length right side erector spinae L-1 to L-5 twitch             PT Short Term Goals - 07/18/17 1110      PT SHORT TERM GOAL #1   Title  Patient to be compliant with appropriate HEP, to be updated PRN     Baseline  Pt has been given HEP    Time  1    Period  Weeks    Status  On-going      PT SHORT TERM GOAL #2   Title  Patient to demonstrate full lumbar flexion ROM without increase in pain or peripheralization of pain in order to assist in return to full work duties     Baseline  Pt still with sciatic nerve pain with lumbar flexion. but pain decreased with TPDN    Time  4    Period  Weeks    Status  On-going      PT SHORT TERM GOAL #3   Title  Patient to report pain as being no more than 2/10 at worst in order to improve QOL and facilitate return to regular activities     Baseline  PT reported 5/10  pain    Time  4    Period  Weeks    Status  On-going      PT SHORT TERM GOAL #4   Title  Patient to demonstrate good functional mechanics for bed mobility, floor to waist height lifting, and patient care in order to prevent exacerbation of pain while at work     Time  4    Period  Weeks    Status  Unable to assess      PT SHORT TERM GOAL #5   Title  Patient to be educated regarding appropriate advanced activities to perform at the gym to assist in prevention of back pain reoccurrance     Time  4    Period  Weeks    Status  On-going               Plan - 07/18/17 1100    Clinical Impression Statement  Pt return to clinic with 5/10 pain in left low back. ( quadratus lumborum)  Pt performs extension based exercises and was show sciatic nerve glide and childs pose for left Quadratus lumborum.  Pt consented to TPDN and was closely monitored throughout session.  Pt with decreased spasm post TPDN and reports 2/10 pain after RX.   Pt also with a tick bite at about T 10 level over spinous process.  took picture for pt to keep a watch over it.    Rehab Potential  Excellent    PT Frequency  -- 1-2 x a week    PT Duration  -- 6 visits    PT Treatment/Interventions  ADLs/Self Care Home Management;Biofeedback;Cryotherapy;Electrical Stimulation;Iontophoresis 4mg /ml Dexamethasone;Moist Heat;Ultrasound;Functional mobility training;Therapeutic activities;Therapeutic exercise;Balance training;Neuromuscular re-education;Patient/family education;Manual techniques;Passive range of motion;Taping;Dry needling    PT Next Visit Plan  review HEP and goals; avoid flexion, focus on extension based program. STM.  assess TPDN benefit    PT Home Exercise Plan  Eval: lumbar support, bridges, TA sets, POE, prone press ups sciatic nerve glide and childs pose right sidelying QL stretch    Consulted and Agree with Plan of Care  Patient       Patient will benefit from skilled therapeutic intervention in order to  improve the following deficits and impairments:  Increased fascial restricitons, Improper body mechanics, Pain, Increased muscle spasms, Postural dysfunction, Decreased range of motion, Decreased strength, Hypomobility, Impaired flexibility  Visit Diagnosis: Acute left-sided low back pain with left-sided sciatica  Muscle weakness (generalized)  Cramp and spasm     Problem List There are no active problems to display for this patient.   Garen Lah, PT Certified Exercise Expert for the Aging Adult  07/18/17 11:15 AM Phone: [phone] Fax: [phone]  [facility] [address] [place], [place], [address] Phone: [phone]   Fax:  [phone]  Name: Amy Ingram MRN: [id number] Date of Birth: 07-Nov-1985

## 2017-07-18 NOTE — Patient Instructions (Addendum)
     Trigger Point Dry Needling  . What is Trigger Point Dry Needling (DN)? o DN is a physical therapy technique used to treat muscle pain and dysfunction. Specifically, DN helps deactivate muscle trigger points (muscle knots).  o A thin filiform needle is used to penetrate the skin and stimulate the underlying trigger point. The goal is for a local twitch response (LTR) to occur and for the trigger point to relax. No medication of any kind is injected during the procedure.   . What Does Trigger Point Dry Needling Feel Like?  o The procedure feels different for each individual patient. Some patients report that they do not actually feel the needle enter the skin and overall the process is not painful. Very mild bleeding may occur. However, many patients feel a deep cramping in the muscle in which the needle was inserted. This is the local twitch response.   Marland Kitchen. How Will I feel after the treatment? o Soreness is normal, and the onset of soreness may not occur for a few hours. Typically this soreness does not last longer than two days.  o Bruising is uncommon, however; ice can be used to decrease any possible bruising.  o In rare cases feeling tired or nauseous after the treatment is normal. In addition, your symptoms may get worse before they get better, this period will typically not last longer than 24 hours.   . What Can I do After My Treatment? o Increase your hydration by drinking more water for the next 24 hours. o You may place ice or heat on the areas treated that have become sore, however, do not use heat on inflamed or bruised areas. Heat often brings more relief post needling. o You can continue your regular activities, but vigorous activity is not recommended initially after the treatment for 24 hours. o DN is best combined with other physical therapy such as strengthening, stretching, and other therapies.   Garen LahLawrie Beardsley, PT Certified Exercise Expert for the Aging Adult    07/18/17 10:33 AM Phone: [phone] Fax: [phone]

## 2017-07-26 ENCOUNTER — Ambulatory Visit: Payer: PRIVATE HEALTH INSURANCE | Admitting: Physical Therapy

## 2017-07-27 ENCOUNTER — Encounter: Payer: Self-pay | Admitting: Physical Therapy

## 2017-07-27 ENCOUNTER — Ambulatory Visit: Payer: PRIVATE HEALTH INSURANCE | Admitting: Physical Therapy

## 2017-07-27 DIAGNOSIS — M5442 Lumbago with sciatica, left side: Secondary | ICD-10-CM

## 2017-07-27 DIAGNOSIS — R252 Cramp and spasm: Secondary | ICD-10-CM

## 2017-07-27 DIAGNOSIS — M6281 Muscle weakness (generalized): Secondary | ICD-10-CM

## 2017-07-27 NOTE — Therapy (Signed)
[facility] [address] [place], [place], [address] Phone: [phone]   Fax:  [phone]  Physical Therapy Treatment  Patient Details  Name: Amy Ingram MRN: [id number] Date of Birth: 1985/03/05 Referring Provider: Lanell Personshomas Kingsley    Encounter Date: 07/27/2017  PT End of Session - 07/27/17 1449    Visit Number  3    Number of Visits  6    Date for PT Re-Evaluation  08/07/17    Authorization Type  Workers comp (6 visits approved)    Authorization - Visit Number  3    Authorization - Number of Visits  6    PT Start Time  1447    PT Stop Time  1525    PT Time Calculation (min)  38 min    Activity Tolerance  Patient tolerated treatment well    Behavior During Therapy  [facility] for tasks assessed/performed       Past Medical History:  Diagnosis Date  . Allergy     History reviewed. No pertinent surgical history.  There were no vitals filed for this visit.  Subjective Assessment - 07/27/17 1449    Subjective  Sore today, nerve pain is not constant. cont nerve pain when looking down.     Patient Stated Goals  get rid of pain     Currently in Pain?  Yes    Pain Score  4     Pain Location  Back    Pain Orientation  Lower;Left                       [facility] Adult PT Treatment/Exercise - 07/27/17 0001      Lumbar Exercises: Stretches   Other Lumbar Stretch Exercise  sciatic nerve glide    Other Lumbar Stretch Exercise  LTR      Lumbar Exercises: Standing   Other Standing Lumbar Exercises  Rt shouler to wall-shift Rt hip to wall from neural tension point    Other Standing Lumbar Exercises  abdominal engagement+hip hinge- heavy cuing required      Lumbar Exercises: Supine   Pelvic Tilt Limitations  pelvic tilt with ball squeeze.    Bridge  15 reps cues for abdominal engagement      Manual Therapy   Manual Therapy  Joint mobilization    Joint Mobilization  lower thoracic & lumbar spine central & lateral  PAs gr4    Soft tissue mobilization  bil QL & lumbar paraspinals               PT Short Term Goals - 07/18/17 1110      PT SHORT TERM GOAL #1   Title  Patient to be compliant with appropriate HEP, to be updated PRN     Baseline  Pt has been given HEP    Time  1    Period  Weeks    Status  On-going      PT SHORT TERM GOAL #2   Title  Patient to demonstrate full lumbar flexion ROM without increase in pain or peripheralization of pain in order to assist in return to full work duties     Baseline  Pt still with sciatic nerve pain with lumbar flexion. but pain decreased with TPDN    Time  4    Period  Weeks    Status  On-going      PT SHORT TERM GOAL #3   Title  Patient to report pain as being  no more than 2/10 at worst in order to improve QOL and facilitate return to regular activities     Baseline  PT reported 5/10 pain    Time  4    Period  Weeks    Status  On-going      PT SHORT TERM GOAL #4   Title  Patient to demonstrate good functional mechanics for bed mobility, floor to waist height lifting, and patient care in order to prevent exacerbation of pain while at work     Time  4    Period  Weeks    Status  Unable to assess      PT SHORT TERM GOAL #5   Title  Patient to be educated regarding appropriate advanced activities to perform at the gym to assist in prevention of back pain reoccurrance     Time  4    Period  Weeks    Status  On-going               Plan - 07/27/17 1525    Clinical Impression Statement  Pt with continued neurological symptoms when flexing through cervical spine. Able to resolve symptoms with shift of Rt hip to wall today. Poor ability to engage abdominal wall and assistance required for proper hip hinge. Discussed importance of these activations/movements in patient care when she is at work. Asked pt to perform shift/abdominal engagement as soon as possible when she feels pain. Did not DN today due to lack of trigger points, manual  focused on joint mobility.     PT Treatment/Interventions  ADLs/Self Care Home Management;Biofeedback;Cryotherapy;Electrical Stimulation;Iontophoresis 4mg /ml Dexamethasone;Moist Heat;Ultrasound;Functional mobility training;Therapeutic activities;Therapeutic exercise;Balance training;Neuromuscular re-education;Patient/family education;Manual techniques;Passive range of motion;Taping;Dry needling    PT Next Visit Plan  TPDN PRN, review hip hinge, how did shift go?, continue mobilization of lumbar spine, continue extension program    PT Home Exercise Plan  Eval: lumbar support, bridges, TA sets, POE, prone press ups sciatic nerve glide and childs pose right sidelying QL stretch; hip hinge flexion, right shift at wall    Consulted and Agree with Plan of Care  Patient       Patient will benefit from skilled therapeutic intervention in order to improve the following deficits and impairments:  Increased fascial restricitons, Improper body mechanics, Pain, Increased muscle spasms, Postural dysfunction, Decreased range of motion, Decreased strength, Hypomobility, Impaired flexibility  Visit Diagnosis: Acute left-sided low back pain with left-sided sciatica  Muscle weakness (generalized)  Cramp and spasm     Problem List There are no active problems to display for this patient.  Arael Piccione C. Laquitha Heslin PT, DPT 07/27/17 3:51 PM   [facility] [address] [place], [place], [address] Phone: [phone]   Fax:  [phone]  Name: Amy Ingram MRN: [id number] Date of Birth: 11-08-1985

## 2017-07-31 ENCOUNTER — Encounter: Payer: Self-pay | Admitting: Physical Therapy

## 2017-07-31 ENCOUNTER — Ambulatory Visit: Payer: PRIVATE HEALTH INSURANCE | Admitting: Physical Therapy

## 2017-07-31 DIAGNOSIS — M5442 Lumbago with sciatica, left side: Secondary | ICD-10-CM

## 2017-07-31 DIAGNOSIS — R252 Cramp and spasm: Secondary | ICD-10-CM

## 2017-07-31 DIAGNOSIS — M6281 Muscle weakness (generalized): Secondary | ICD-10-CM

## 2017-07-31 NOTE — Therapy (Signed)
[facility] [address] [place], [place], [address] Phone: [phone]   Fax:  [phone]  Physical Therapy Treatment  Patient Details  Name: Amy Ingram MRN: [id number] Date of Birth: March 10, 1985 Referring Provider: Lanell Personshomas Kingsley    Encounter Date: 07/31/2017  PT End of Session - 07/31/17 1152    Visit Number  4    Number of Visits  6    Date for PT Re-Evaluation  08/07/17    Authorization Type  Workers comp (6 visits approved)    Authorization Time Period  07/10/17 to 08/10/17    Authorization - Visit Number  4    Authorization - Number of Visits  6    PT Start Time  1151 pt arrived late    PT Stop Time  1229    PT Time Calculation (min)  38 min    Activity Tolerance  Patient tolerated treatment well    Behavior During Therapy  [facility] for tasks assessed/performed       Past Medical History:  Diagnosis Date  . Allergy     History reviewed. No pertinent surgical history.  There were no vitals filed for this visit.  Subjective Assessment - 07/31/17 1152    Subjective  "so-so"    have been doing shift but by the time I leave the iritating position and move to shift it has gone away.     Patient Stated Goals  get rid of pain     Currently in Pain?  No/denies                       [facility] PT Treatment/Exercise - 07/31/17 0001      Lumbar Exercises: Standing   Other Standing Lumbar Exercises  hip hinge with back to wall      Lumbar Exercises: Sidelying   Clam  Both;20 reps cues for core      Lumbar Exercises: Quadruped   Single Arm Raise  Right;Left alt, elbows on bolster, cues for core    Other Quadruped Lumbar Exercises  qped hip hinge    Other Quadruped Lumbar Exercises  knees/elbows plank      Manual Therapy   Joint Mobilization  Gr4 PA & Lt unilateral T12-L5 T12& L1 sig limited               PT Short Term Goals - 07/18/17 1110      PT SHORT TERM GOAL #1   Title  Patient to  be compliant with appropriate HEP, to be updated PRN     Baseline  Pt has been given HEP    Time  1    Period  Weeks    Status  On-going      PT SHORT TERM GOAL #2   Title  Patient to demonstrate full lumbar flexion ROM without increase in pain or peripheralization of pain in order to assist in return to full work duties     Baseline  Pt still with sciatic nerve pain with lumbar flexion. but pain decreased with TPDN    Time  4    Period  Weeks    Status  On-going      PT SHORT TERM GOAL #3   Title  Patient to report pain as being no more than 2/10 at worst in order to improve QOL and facilitate return to regular activities     Baseline  PT reported 5/10 pain    Time  4  Period  Weeks    Status  On-going      PT SHORT TERM GOAL #4   Title  Patient to demonstrate good functional mechanics for bed mobility, floor to waist height lifting, and patient care in order to prevent exacerbation of pain while at work     Time  4    Period  Weeks    Status  Unable to assess      PT SHORT TERM GOAL #5   Title  Patient to be educated regarding appropriate advanced activities to perform at the gym to assist in prevention of back pain reoccurrance     Time  4    Period  Weeks    Status  On-going               Plan - 07/31/17 1259    Clinical Impression Statement  Able to perform prone extension with PA pressure applied to T12 vertebrae. good tolerance to exercise and improved performance of hip hinge. Cues for abdominal engagement required. Will benefit from further strengthening and core activation training to support spine. Was able to demo hand washing wihtout pain today and asked her to be aware of pain levels until her next appointment.     PT Treatment/Interventions  ADLs/Self Care Home Management;Biofeedback;Cryotherapy;Electrical Stimulation;Iontophoresis 4mg /ml Dexamethasone;Moist Heat;Ultrasound;Functional mobility training;Therapeutic activities;Therapeutic exercise;Balance  training;Neuromuscular re-education;Patient/family education;Manual techniques;Passive range of motion;Taping;Dry needling    PT Next Visit Plan  core stabilization    PT Home Exercise Plan  Eval: lumbar support, bridges, TA sets, POE, prone press ups sciatic nerve glide and childs pose right sidelying QL stretch; hip hinge flexion, right shift at wall; hip hinge to child pose, qped single arm raise, plank    Consulted and Agree with Plan of Care  Patient       Patient will benefit from skilled therapeutic intervention in order to improve the following deficits and impairments:  Increased fascial restricitons, Improper body mechanics, Pain, Increased muscle spasms, Postural dysfunction, Decreased range of motion, Decreased strength, Hypomobility, Impaired flexibility  Visit Diagnosis: Acute left-sided low back pain with left-sided sciatica  Muscle weakness (generalized)  Cramp and spasm     Problem List There are no active problems to display for this patient.   Amy Ingram PT, DPT 07/31/17 1:02 PM   [facility] [address] [place], [place], [address] Phone: [phone]   Fax:  [phone]  Name: Amy Ingram MRN: [id number] Date of Birth: 25-Jul-1985

## 2017-08-08 ENCOUNTER — Encounter: Payer: Self-pay | Admitting: Physical Therapy

## 2017-08-08 ENCOUNTER — Ambulatory Visit: Payer: PRIVATE HEALTH INSURANCE | Attending: Nurse Practitioner | Admitting: Physical Therapy

## 2017-08-08 DIAGNOSIS — M5442 Lumbago with sciatica, left side: Secondary | ICD-10-CM | POA: Diagnosis present

## 2017-08-08 DIAGNOSIS — R252 Cramp and spasm: Secondary | ICD-10-CM | POA: Diagnosis present

## 2017-08-08 DIAGNOSIS — M6281 Muscle weakness (generalized): Secondary | ICD-10-CM | POA: Diagnosis present

## 2017-08-08 NOTE — Therapy (Signed)
[facility] [address] [place], [place], [address] Phone: [phone]   Fax:  [phone]  Physical Therapy Treatment  Patient Details  Name: [name] MRN: [id number] Date of Birth: [birth year] Referring Provider: Lanell Personshomas Kingsley    Encounter Date: 08/08/2017  PT End of Session - 08/08/17 1310    Visit Number  5    Number of Visits  6    Date for PT Re-Evaluation  08/07/17    Authorization Type  Workers comp (6 visits approved)    Authorization Time Period  07/10/17 to 08/10/17    Authorization - Visit Number  5    Authorization - Number of Visits  6    PT Start Time  1017    PT Stop Time  1114    PT Time Calculation (min)  57 min    Activity Tolerance  Patient tolerated treatment well    Behavior During Therapy  [facility] for tasks assessed/performed       Past Medical History:  Diagnosis Date  . Allergy     History reviewed. No pertinent surgical history.  There were no vitals filed for this visit.  Subjective Assessment - 08/08/17 1021    Subjective  I still have that " nerve " pain.  When PT presses into Left Quadratus lumborum and left gluteals sharp pain    How long can you sit comfortably?  30 minute TV show,  I feel pain when repositioning couch from supine to sitting    How long can you stand comfortably?  Bending  over to care for patients.   5-10 minutes     How long can you walk comfortably?  sometimes, not consistent problems     Patient Stated Goals  get rid of pain     Currently in Pain?  Yes    Pain Score  2     Pain Location  Back    Pain Orientation  Left;Lower    Pain Descriptors / Indicators  Radiating    Pain Type  Chronic pain    Pain Radiating Towards  radiating down to back of my lower leg to foot    Pain Onset  More than a month ago                       [facility] Treatment/Exercise - 08/08/17 1027      Lumbar Exercises: Standing   Other Standing Lumbar Exercises   hip hinge with back to wall x 5 then lifting 15# KB from chair seat with gluteal contraction x 15    Other Standing Lumbar Exercises  goblet squat 10 x 2 with 5 #      Lumbar Exercises: Supine   Pelvic Tilt Limitations  pelvic tilt with ball squeeze.    Bridge  15 reps cues for abdominal engagement      Lumbar Exercises: Sidelying   Clam  Both;20 reps cues for core      Lumbar Exercises: Prone   Other Prone Lumbar Exercises  , prone press ups 1x10       Lumbar Exercises: Quadruped   Single Arm Raise  --    Other Quadruped Lumbar Exercises  --    Other Quadruped Lumbar Exercises  --      Moist Heat Therapy   Number Minutes Moist Heat  15 Minutes    Moist Heat Location  Lumbar Spine left Quadratus Lumborum      Manual Therapy  Manual Therapy  Joint mobilization;Soft tissue mobilization    Joint Mobilization  Grade 4 PA mob sp process T/12 to L5  T12& L1 sig limited    Soft tissue mobilization  left quadratus lumborum    Myofascial Release  left quadratus lumborum       Trigger Point Dry Needling - 08/08/17 1308    Consent Given?  Yes    Education Handout Provided  No previously given    Muscles Treated Upper Body  Quadratus Lumborum left side only    Muscles Treated Lower Body  Gluteus minimus;Gluteus maximus;Piriformis    Longissimus Response  Palpable increased muscle length Lumbar 3-s-1 left only    Gluteus Maximus Response  Twitch response elicited;Palpable increased muscle length alneedling on left side only    Gluteus Minimus Response  Twitch response elicited;Palpable increased muscle length    Piriformis Response  Twitch response elicited;Palpable increased muscle length           PT Education - 08/08/17 1045    Education Details  Added goblet squat and yellow t band to clams    Person(s) Educated  Patient    Methods  Explanation;Demonstration;Tactile cues;Verbal cues;Handout    Comprehension  Verbalized understanding;Returned demonstration       PT Short  Term Goals - 08/08/17 1030      PT SHORT TERM GOAL #1   Title  Patient to be compliant with appropriate HEP, to be updated PRN     Baseline  Independent with current HEP and needs to advance    Time  1    Period  Weeks    Status  Achieved      PT SHORT TERM GOAL #2   Title  Patient to demonstrate full lumbar flexion ROM without increase in pain or peripheralization of pain in order to assist in return to full work duties     Baseline  Pt still with sciatic nerve pain with lumbar flexion. but pain decreased with TPDN    Time  4    Period  Weeks    Status  On-going      PT SHORT TERM GOAL #3   Title  Patient to report pain as being no more than 2/10 at worst in order to improve QOL and facilitate return to regular activities     Baseline  Pt reports 2/10 and " twingy pain"    Time  4    Period  Weeks    Status  On-going      PT SHORT TERM GOAL #4   Title  Patient to demonstrate good functional mechanics for bed mobility, floor to waist height lifting, and patient care in order to prevent exacerbation of pain while at work     Baseline  Practicing with 15 # DB today and correct technique    Time  4    Period  Weeks    Status  On-going      PT SHORT TERM GOAL #5   Title  Patient to be educated regarding appropriate advanced activities to perform at the gym to assist in prevention of back pain reoccurrance     Baseline  using KB for weights and hip hinging activities  will assess with greater weight to simulate work activities    Time  4    Period  Weeks    Status  On-going               Plan - 08/08/17 1313    Clinical Impression  Statement  Pt with 2/10 pain especially when making transitional movements and turning over in bed.  Pt able to perform Deadlifting with cues and trying to simulate work situations.. Pt consented to TPDN and was closely monitored througthout session with decreased in tissue tension and pain .  Pt was decreased pain at end of session.  Emphasized  the importance of strengthening especially for Hypermobility.  Will continue to advance HEP untila last visit.     Rehab Potential  Excellent    PT Treatment/Interventions  ADLs/Self Care Home Management;Biofeedback;Cryotherapy;Electrical Stimulation;Iontophoresis 4mg /ml Dexamethasone;Moist Heat;Ultrasound;Functional mobility training;Therapeutic activities;Therapeutic exercise;Balance training;Neuromuscular re-education;Patient/family education;Manual techniques;Passive range of motion;Taping;Dry needling    PT Next Visit Plan  core stabilization assess goals/finalize HEP with quadriped core maximize core exercises, 6th visit last visit scheduled     PT Home Exercise Plan  Eval: lumbar support, bridges, TA sets, POE, prone press ups sciatic nerve glide and childs pose right sidelying QL stretch; hip hinge flexion, right shift at wall; hip hinge to child pose, qped single arm raise, plank, globlet squats,     Consulted and Agree with Plan of Care  Patient       Patient will benefit from skilled therapeutic intervention in order to improve the following deficits and impairments:  Increased fascial restricitons, Improper body mechanics, Pain, Increased muscle spasms, Postural dysfunction, Decreased range of motion, Decreased strength, Hypomobility, Impaired flexibility  Visit Diagnosis: Acute left-sided low back pain with left-sided sciatica  Muscle weakness (generalized)  Cramp and spasm     Problem List There are no active problems to display for this patient.   Garen Lah, PT Certified Exercise Expert for the Aging Adult  08/08/17 1:23 PM Phone: [phone] Fax: [phone]  [facility] [address] [place], [place], [address] Phone: [phone]   Fax:  [phone]  Name: Amy Ingram MRN: [id number] Date of Birth: [birth year]

## 2017-08-08 NOTE — Patient Instructions (Signed)
     HIP: Abduction / External Rotation (Band)   Place band around knees. Lie on side with hips and knees bent. Raise top knee up, squeezing glutes. Keep feet together. Hold 3___ seconds. Use ___yellow_____ band. 15___ reps per set, 2___ sets per day, _7__ days per week be on left and right side  Garen LahLawrie Beardsley, PT Certified Exercise Expert for the Aging Adult  08/08/17 10:45 AM Phone: [phone] Fax: [phone]

## 2017-08-11 ENCOUNTER — Encounter: Payer: Self-pay | Admitting: Physical Therapy

## 2017-08-11 ENCOUNTER — Ambulatory Visit: Payer: PRIVATE HEALTH INSURANCE | Attending: Family Medicine | Admitting: Physical Therapy

## 2017-08-11 DIAGNOSIS — M5442 Lumbago with sciatica, left side: Secondary | ICD-10-CM | POA: Insufficient documentation

## 2017-08-11 DIAGNOSIS — R252 Cramp and spasm: Secondary | ICD-10-CM | POA: Insufficient documentation

## 2017-08-11 DIAGNOSIS — M6281 Muscle weakness (generalized): Secondary | ICD-10-CM | POA: Diagnosis present

## 2017-08-11 NOTE — Therapy (Addendum)
[place], [place], [address] Phone: [phone]   Fax:  [phone]  Physical Therapy Treatment/Re-certification/Discharge  Patient Details  Name: Amy Ingram MRN: [id number] Date of Birth: May 27, 1985 Referring Provider: Odis Luster   Encounter Date: 08/11/2017  PT End of Session - 08/11/17 1031    Visit Number  6    Number of Visits  6    Date for PT Re-Evaluation  08/07/17    Authorization Type  Workers comp (6 visits approved)    Authorization Time Period  07/10/17 to 08/10/17    Authorization - Visit Number  6    Authorization - Number of Visits  6    PT Start Time  1030    PT Stop Time  1112    PT Time Calculation (min)  42 min    Activity Tolerance  Patient tolerated treatment well    Behavior During Therapy  [facility] for tasks assessed/performed       Past Medical History:  Diagnosis Date  . Allergy     History reviewed. No pertinent surgical history.  There were no vitals filed for this visit.  Subjective Assessment - 08/11/17 1032    Subjective  It is better in than the pain is not constant. It is the nerve pain that still bothers me. I feel it down my leg when bending forward or bringing knee to chest.     Patient Stated Goals  get rid of pain     Currently in Pain?  No/denies         [facility] PT Assessment - 08/11/17 0001      Assessment   Medical Diagnosis  LBP    Referring Provider  Odis Luster    Onset Date/Surgical Date  06/01/17      Prior Function   Vocation Requirements  RN    Leisure  gym      Strength   Right Hip Extension  5/5    Right Hip ABduction  4+/5    Left Hip Extension  4-/5    Left Hip ABduction  4+/5   mild Lt hip pain                  [facility] Adult PT Treatment/Exercise - 08/11/17 0001      Therapeutic Activites    Therapeutic Activities  Work Simulation    Work Simulation  patient bed mobility      Exercises   Exercises  Lumbar       Lumbar Exercises: Seated   Other Seated Lumbar Exercises  seated nerve glides via cervical flx/ext      Lumbar Exercises: Prone   Straight Leg Raise  20 reps    Straight Leg Raises Limitations  chest propped on pillow for extension    Other Prone Lumbar Exercises  hip extension with knee bent- cues for glut activation    Other Prone Lumbar Exercises  prone press up x10      Manual Therapy   Joint Mobilization  Lumbar Gr4 PA, Lt lumbar unilat PA gr 4    Soft tissue mobilization  Lt QL, glut max             PT Education - 08/11/17 2033    Education Details  importance of body mechanics when moving patients, anatomy of condition, POC, HEP    Person(s) Educated  Patient    Methods  Explanation    Comprehension  Verbalized understanding;Need further instruction  PT Short Term Goals - 08/11/17 2033      PT SHORT TERM GOAL #1   Title  Patient to be compliant with appropriate HEP, to be updated PRN     Baseline  Independent with current HEP and needs to advance    Status  Achieved      PT SHORT TERM GOAL #2   Title  Patient to demonstrate full lumbar flexion ROM without increase in pain or peripheralization of pain in order to assist in return to full work duties     Baseline  mild soreness to palpation at Lt side of L3 & L4 vertebrae, cont to demonstrate neural tension in lumbar flexion when paired with cervical flexion    Time  4    Period  Weeks    Status  On-going    Target Date  09/08/17      PT SHORT TERM GOAL #3   Title  Patient to report pain as being no more than 2/10 at worst in order to improve QOL and facilitate return to regular activities     Baseline  Pt reports 2/10 and " twingy pain"  continued neural symptoms    Time  4    Period  Weeks    Status  On-going    Target Date  09/08/17      PT SHORT TERM GOAL #4   Title  Patient to demonstrate good functional mechanics for bed mobility, floor to waist height lifting, and patient care in order to  prevent exacerbation of pain while at work     Baseline  compensations made to avoid cervical flexion which results in neural symptoms, good use of LE in patient mobility but poor understanding of UE use    Time  4    Period  Weeks    Status  On-going    Target Date  09/08/17      PT SHORT TERM GOAL #5   Title  Patient to be educated regarding appropriate advanced activities to perform at the gym to assist in prevention of back pain reoccurrance     Baseline  using KB for weights and hip hinging activities  will assess with greater weight to simulate work activities....unable to progress at this time due to continued neural symptoms    Time  4    Status  On-going    Target Date  09/08/17               Plan - 08/11/17 1112    Clinical Impression Statement  L3, L4 vertebrae rotated to the Rt. Able to recreate neural tension pain in seated slouch with head dropped but pt did not c/o increase muscular pain. pain immediately resolved with release of tension. Discussed proper bed mobility with patients and techniques to reduce risk of injury to back/neck/shoulders. Improving mobility through lumbar spine but is still limited. Is going to follow up with MD today and will request more visits from [facility]. Pt will cont to benefit from skilled PT to decrease limitations in mobility and challenge functional strength/mobility to reduce pain, reach long term goals and reduce risk of furture injury.     PT Treatment/Interventions  ADLs/Self Care Home Management;Biofeedback;Cryotherapy;Electrical Stimulation;Iontophoresis 64m/ml Dexamethasone;Moist Heat;Ultrasound;Functional mobility training;Therapeutic activities;Therapeutic exercise;Balance training;Neuromuscular re-education;Patient/family education;Manual techniques;Passive range of motion;Taping;Dry needling    PT Next Visit Plan  core stabs, joint mobility, nerve glides, ther act    PT Home Exercise Plan  Eval: lumbar support, bridges, TA sets, POE,  prone press  ups sciatic nerve glide and childs pose right sidelying QL stretch; hip hinge flexion, right shift at wall; hip hinge to child pose, qped single arm raise, plank, globlet squats,     Consulted and Agree with Plan of Care  Patient       Patient will benefit from skilled therapeutic intervention in order to improve the following deficits and impairments:  Increased fascial restricitons, Improper body mechanics, Pain, Increased muscle spasms, Postural dysfunction, Decreased range of motion, Decreased strength, Hypomobility, Impaired flexibility  Visit Diagnosis: Acute left-sided low back pain with left-sided sciatica - Plan: PT plan of care cert/re-cert  Muscle weakness (generalized) - Plan: PT plan of care cert/re-cert  Cramp and spasm - Plan: PT plan of care cert/re-cert     Problem List There are no active problems to display for this patient.   Najmo Pardue C. Katelyn Kohlmeyer PT, DPT 08/11/17 8:41 PM   [facility] [address] [place], [place], [address] Phone: [phone]   Fax:  [phone]  Name: Amy Ingram MRN: [id number] Date of Birth: 1985-08-30  PHYSICAL THERAPY DISCHARGE SUMMARY  Visits from Start of Care: 6  Current functional level related to goals / functional outcomes: See above   Remaining deficits: See above   Education / Equipment: Anatomy of condition, POC, HEP, exercise form/rationale  Plan: Patient agrees to discharge.  Patient goals were partially met. Patient is being discharged due to financial reasons.  ?????    Completed approved WC visits.  Pranay Hilbun C. Kerem Gilmer PT, DPT 09/27/17 9:49 AM

## 2017-08-25 ENCOUNTER — Other Ambulatory Visit (HOSPITAL_COMMUNITY): Payer: Self-pay | Admitting: Orthopedic Surgery

## 2017-08-25 ENCOUNTER — Other Ambulatory Visit (HOSPITAL_BASED_OUTPATIENT_CLINIC_OR_DEPARTMENT_OTHER): Payer: Self-pay | Admitting: Orthopedic Surgery

## 2017-08-25 DIAGNOSIS — S335XXA Sprain of ligaments of lumbar spine, initial encounter: Secondary | ICD-10-CM

## 2017-08-29 ENCOUNTER — Ambulatory Visit (HOSPITAL_COMMUNITY): Admission: RE | Admit: 2017-08-29 | Payer: 59 | Source: Ambulatory Visit

## 2017-09-02 ENCOUNTER — Ambulatory Visit (HOSPITAL_COMMUNITY)
Admission: RE | Admit: 2017-09-02 | Discharge: 2017-09-02 | Disposition: A | Discharge status: Home / Self Care | Payer: PRIVATE HEALTH INSURANCE | Source: Ambulatory Visit | Attending: Orthopedic Surgery | Admitting: Orthopedic Surgery

## 2017-09-02 DIAGNOSIS — X58XXXA Exposure to other specified factors, initial encounter: Secondary | ICD-10-CM | POA: Diagnosis not present

## 2017-09-02 DIAGNOSIS — S335XXA Sprain of ligaments of lumbar spine, initial encounter: Secondary | ICD-10-CM | POA: Diagnosis not present

## 2017-09-02 DIAGNOSIS — M5126 Other intervertebral disc displacement, lumbar region: Secondary | ICD-10-CM | POA: Diagnosis not present

## 2017-09-02 DIAGNOSIS — M5127 Other intervertebral disc displacement, lumbosacral region: Secondary | ICD-10-CM | POA: Diagnosis not present

## 2017-10-30 ENCOUNTER — Other Ambulatory Visit: Payer: Self-pay | Admitting: Orthopedic Surgery

## 2017-10-30 DIAGNOSIS — M533 Sacrococcygeal disorders, not elsewhere classified: Secondary | ICD-10-CM

## 2017-11-21 ENCOUNTER — Ambulatory Visit
Admission: RE | Admit: 2017-11-21 | Discharge: 2017-11-21 | Disposition: A | Discharge status: Home / Self Care | Payer: PRIVATE HEALTH INSURANCE | Source: Ambulatory Visit | Attending: Orthopedic Surgery | Admitting: Orthopedic Surgery

## 2017-11-21 ENCOUNTER — Other Ambulatory Visit: Payer: Self-pay | Admitting: Family Medicine

## 2017-11-21 DIAGNOSIS — M533 Sacrococcygeal disorders, not elsewhere classified: Secondary | ICD-10-CM

## 2017-11-27 ENCOUNTER — Other Ambulatory Visit: Payer: Self-pay | Admitting: Orthopedic Surgery

## 2017-11-27 DIAGNOSIS — M5416 Radiculopathy, lumbar region: Secondary | ICD-10-CM

## 2017-12-05 ENCOUNTER — Other Ambulatory Visit: Payer: Self-pay

## 2017-12-08 ENCOUNTER — Ambulatory Visit
Admission: RE | Admit: 2017-12-08 | Discharge: 2017-12-08 | Disposition: A | Discharge status: Home / Self Care | Payer: PRIVATE HEALTH INSURANCE | Source: Ambulatory Visit | Attending: Orthopedic Surgery | Admitting: Orthopedic Surgery

## 2017-12-08 DIAGNOSIS — M5416 Radiculopathy, lumbar region: Secondary | ICD-10-CM

## 2017-12-08 MED ORDER — METHYLPREDNISOLONE ACETATE 40 MG/ML INJ SUSP (RADIOLOG: 120.0000 mg | Freq: Once | INTRAMUSCULAR | Status: DC

## 2018-01-12 ENCOUNTER — Other Ambulatory Visit: Payer: Self-pay | Admitting: Orthopedic Surgery

## 2018-01-16 NOTE — Pre-Procedure Instructions (Signed)
Nikkisha Lipe Safran  01/16/2018      [id number] - [place], [place] - [address] [place] [address] Phone: [phone] Fax: [phone]    Your procedure is scheduled on Thursday January 16th.  Report to [facility] Admitting at 9:15 A.M.  Call this number if you have problems the morning of surgery:  [phone]   Remember:  Do not eat or drink after midnight.     Take these medicines the morning of surgery with A SIP OF WATER  methocarbamol (ROBAXIN) if needed  7 days prior to surgery STOP taking any Aspirin(unless otherwise instructed by your surgeon), Voltaren,  Aleve, Naproxen, Ibuprofen, Motrin, Advil, Goody's, BC's, all herbal medications, fish oil, and all vitamins     Do not wear jewelry, make-up or nail polish.  Do not wear lotions, powders, or perfumes, or deodorant.  Do not shave 48 hours prior to surgery.    Do not bring valuables to the hospital.  [facility] is not responsible for any belongings or valuables.  Contacts, eyeglasses, hearing aids, dentures or bridgework may not be worn into surgery.  Leave your suitcase in the car.  After surgery it may be brought to your room.  For patients admitted to the hospital, discharge time will be determined by your treatment team.  Patients discharged the day of surgery will not be allowed to drive home.   [place]- Preparing For Surgery  Before surgery, you can play an important role. Because skin is not sterile, your skin needs to be as free of germs as possible. You can reduce the number of germs on your skin by washing with CHG (chlorahexidine gluconate) Soap before surgery.  CHG is an antiseptic cleaner which kills germs and bonds with the skin to continue killing germs even after washing.    Oral Hygiene is also important to reduce your risk of infection.  Remember - BRUSH YOUR TEETH THE MORNING OF SURGERY WITH YOUR REGULAR  TOOTHPASTE  Please do not use if you have an allergy to CHG or antibacterial soaps. If your skin becomes reddened/irritated stop using the CHG.  Do not shave (including legs and underarms) for at least 48 hours prior to first CHG shower. It is OK to shave your face.  Please follow these instructions carefully.   1. Shower the NIGHT BEFORE SURGERY and the MORNING OF SURGERY with CHG.   2. If you chose to wash your hair, wash your hair first as usual with your normal shampoo.  3. After you shampoo, rinse your hair and body thoroughly to remove the shampoo.  4. Use CHG as you would any other liquid soap. You can apply CHG directly to the skin and wash gently with a scrungie or a clean washcloth.   5. Apply the CHG Soap to your body ONLY FROM THE NECK DOWN.  Do not use on open wounds or open sores. Avoid contact with your eyes, ears, mouth and genitals (private parts). Wash Face and genitals (private parts)  with your normal soap.  6. Wash thoroughly, paying special attention to the area where your surgery will be performed.  7. Thoroughly rinse your body with warm water from the neck down.  8. DO NOT shower/wash with your normal soap after using and rinsing off the CHG Soap.  9. Pat yourself dry with a CLEAN TOWEL.  10. Wear CLEAN PAJAMAS to bed the night before  surgery, wear comfortable clothes the morning of surgery  11. Place CLEAN SHEETS on your bed the night of your first shower and DO NOT SLEEP WITH PETS.    Day of Surgery: Shower as stated above. Do not apply any deodorants/lotions.  Please wear clean clothes to the hospital/surgery center.   Remember to brush your teeth WITH YOUR REGULAR TOOTHPASTE.   Please read over the following fact sheets that you were given.

## 2018-01-17 ENCOUNTER — Encounter (HOSPITAL_COMMUNITY): Payer: Self-pay

## 2018-01-17 ENCOUNTER — Encounter (HOSPITAL_COMMUNITY)
Admission: RE | Admit: 2018-01-17 | Discharge: 2018-01-17 | Disposition: A | Discharge status: Home / Self Care | Payer: PRIVATE HEALTH INSURANCE | Source: Ambulatory Visit | Attending: Orthopedic Surgery | Admitting: Orthopedic Surgery

## 2018-01-17 ENCOUNTER — Other Ambulatory Visit: Payer: Self-pay

## 2018-01-17 DIAGNOSIS — M545 Low back pain: Secondary | ICD-10-CM | POA: Diagnosis present

## 2018-01-17 DIAGNOSIS — Z01812 Encounter for preprocedural laboratory examination: Secondary | ICD-10-CM | POA: Insufficient documentation

## 2018-01-17 DIAGNOSIS — M533 Sacrococcygeal disorders, not elsewhere classified: Secondary | ICD-10-CM | POA: Diagnosis not present

## 2018-01-17 DIAGNOSIS — F17210 Nicotine dependence, cigarettes, uncomplicated: Secondary | ICD-10-CM | POA: Diagnosis not present

## 2018-01-17 LAB — URINALYSIS, ROUTINE W REFLEX MICROSCOPIC
Bilirubin Urine: NEGATIVE
Glucose, UA: NEGATIVE mg/dL
Hgb urine dipstick: NEGATIVE
Ketones, ur: NEGATIVE mg/dL
LEUKOCYTES UA: NEGATIVE
NITRITE: NEGATIVE
Protein, ur: NEGATIVE mg/dL
Specific Gravity, Urine: 1.019 (ref 1.005–1.030)
pH: 8 (ref 5.0–8.0)

## 2018-01-17 LAB — CBC WITH DIFFERENTIAL/PLATELET
Abs Immature Granulocytes: 0.01 10*3/uL (ref 0.00–0.07)
Basophils Absolute: 0.1 10*3/uL (ref 0.0–0.1)
Basophils Relative: 1 %
Eosinophils Absolute: 0.1 10*3/uL (ref 0.0–0.5)
Eosinophils Relative: 1 %
HCT: 44.9 % (ref 36.0–46.0)
HEMOGLOBIN: 14.8 g/dL (ref 12.0–15.0)
Immature Granulocytes: 0 %
LYMPHS ABS: 2 10*3/uL (ref 0.7–4.0)
LYMPHS PCT: 32 %
MCH: 32.2 pg (ref 26.0–34.0)
MCHC: 33 g/dL (ref 30.0–36.0)
MCV: 97.8 fL (ref 80.0–100.0)
MONOS PCT: 7 %
Monocytes Absolute: 0.4 10*3/uL (ref 0.1–1.0)
Neutro Abs: 3.7 10*3/uL (ref 1.7–7.7)
Neutrophils Relative %: 59 %
Platelets: 286 10*3/uL (ref 150–400)
RBC: 4.59 MIL/uL (ref 3.87–5.11)
RDW: 12.4 % (ref 11.5–15.5)
WBC: 6.3 10*3/uL (ref 4.0–10.5)
nRBC: 0 % (ref 0.0–0.2)

## 2018-01-17 LAB — TYPE AND SCREEN
ABO/RH(D): A POS
Antibody Screen: NEGATIVE

## 2018-01-17 LAB — COMPREHENSIVE METABOLIC PANEL
ALT: 14 U/L (ref 0–44)
AST: 18 U/L (ref 15–41)
Albumin: 4.4 g/dL (ref 3.5–5.0)
Alkaline Phosphatase: 48 U/L (ref 38–126)
Anion gap: 12 (ref 5–15)
BUN: 12 mg/dL (ref 6–20)
CO2: 25 mmol/L (ref 22–32)
Calcium: 9.3 mg/dL (ref 8.9–10.3)
Chloride: 101 mmol/L (ref 98–111)
Creatinine, Ser: 0.69 mg/dL (ref 0.44–1.00)
GFR calc Af Amer: >60 mL/min (ref >60)
GFR calc non Af Amer: >60 mL/min (ref >60)
Glucose, Bld: 86 mg/dL (ref 70–99)
Potassium: 3.8 mmol/L (ref 3.5–5.1)
Sodium: 138 mmol/L (ref 135–145)
Total Bilirubin: 0.8 mg/dL (ref 0.3–1.2)
Total Protein: 7.5 g/dL (ref 6.5–8.1)

## 2018-01-17 LAB — PROTIME-INR
INR: 1.06
Prothrombin Time: 13.7 seconds (ref 11.4–15.2)

## 2018-01-17 LAB — ABO/RH: ABO/RH(D): A POS

## 2018-01-17 LAB — SURGICAL PCR SCREEN
MRSA, PCR: POSITIVE — AB
STAPHYLOCOCCUS AUREUS: POSITIVE — AB

## 2018-01-17 LAB — APTT: aPTT: 29 seconds (ref 24–36)

## 2018-01-17 MED ORDER — CEFAZOLIN SODIUM-DEXTROSE 2-4 GM/100ML-% IV SOLN
2.0000 g | INTRAVENOUS | Status: AC
  Administered 2018-01-18: 2 g via INTRAVENOUS
  Filled 2018-01-17: qty 100

## 2018-01-17 MED ORDER — VANCOMYCIN HCL IN DEXTROSE 1-5 GM/200ML-% IV SOLN
1000.0000 mg | INTRAVENOUS | Status: AC
  Administered 2018-01-18: 1000 mg via INTRAVENOUS
  Filled 2018-01-17: qty 200

## 2018-01-17 NOTE — Progress Notes (Signed)
PCP - Mary-Margaret Daphine Deutscher Cardiologist - denies  Chest x-ray - N/A EKG - N/A Stress Test - denies  ECHO - denies Cardiac Cath - denies  Sleep Study - denies  Aspirin Instructions: Patient instructed to hold all Aspirin, NSAID's, herbal medications, fish oil and vitamins 7 days prior to surgery.   Anesthesia review: no  Patient denies shortness of breath, fever, cough and chest pain at PAT appointment   Patient verbalized understanding of instructions that were given to them at the PAT appointment. Patient was also instructed that they will need to review over the PAT instructions again at home before surgery.

## 2018-01-17 NOTE — Progress Notes (Signed)
Left voicemail for Grandville Silos- PA with Dr. Yevette Edwards regarding patients positive PCR- MRSA and MSSA. Contact orders placed in epic.

## 2018-01-18 ENCOUNTER — Encounter (HOSPITAL_COMMUNITY): Payer: Self-pay

## 2018-01-18 ENCOUNTER — Ambulatory Visit (HOSPITAL_COMMUNITY)
Admission: RE | Admit: 2018-01-18 | Discharge: 2018-01-18 | Disposition: A | Discharge status: Home / Self Care | Payer: PRIVATE HEALTH INSURANCE | Attending: Orthopedic Surgery | Admitting: Orthopedic Surgery

## 2018-01-18 ENCOUNTER — Encounter (HOSPITAL_COMMUNITY)
Admission: RE | Disposition: A | Discharge status: Home / Self Care | Payer: Self-pay | Source: Home / Self Care | Attending: Orthopedic Surgery

## 2018-01-18 ENCOUNTER — Ambulatory Visit (HOSPITAL_COMMUNITY): Payer: PRIVATE HEALTH INSURANCE | Admitting: Anesthesiology

## 2018-01-18 ENCOUNTER — Ambulatory Visit (HOSPITAL_COMMUNITY): Payer: PRIVATE HEALTH INSURANCE

## 2018-01-18 DIAGNOSIS — F17210 Nicotine dependence, cigarettes, uncomplicated: Secondary | ICD-10-CM | POA: Insufficient documentation

## 2018-01-18 DIAGNOSIS — M533 Sacrococcygeal disorders, not elsewhere classified: Secondary | ICD-10-CM | POA: Insufficient documentation

## 2018-01-18 DIAGNOSIS — Z419 Encounter for procedure for purposes other than remedying health state, unspecified: Secondary | ICD-10-CM

## 2018-01-18 HISTORY — PX: SACROILIAC JOINT FUSION: SHX6088

## 2018-01-18 LAB — POCT PREGNANCY, URINE: Preg Test, Ur: NEGATIVE

## 2018-01-18 SURGERY — SACROILIAC JOINT FUSION
Anesthesia: General | Laterality: Left

## 2018-01-18 MED ORDER — ROCURONIUM BROMIDE 50 MG/5ML IV SOSY
PREFILLED_SYRINGE | INTRAVENOUS | Status: AC
  Filled 2018-01-18: qty 5

## 2018-01-18 MED ORDER — OXYCODONE HCL 5 MG PO TABS
5.0000 mg | ORAL_TABLET | Freq: Once | ORAL | Status: AC | PRN
  Administered 2018-01-18: 5 mg via ORAL

## 2018-01-18 MED ORDER — DEXAMETHASONE SODIUM PHOSPHATE 10 MG/ML IJ SOLN
INTRAMUSCULAR | Status: DC | PRN
  Administered 2018-01-18: 8 mg via INTRAVENOUS

## 2018-01-18 MED ORDER — ONDANSETRON HCL 4 MG/2ML IJ SOLN
INTRAMUSCULAR | Status: DC | PRN
  Administered 2018-01-18: 4 mg via INTRAVENOUS

## 2018-01-18 MED ORDER — PROPOFOL 10 MG/ML IV BOLUS
INTRAVENOUS | Status: DC | PRN
  Administered 2018-01-18: 150 mg via INTRAVENOUS

## 2018-01-18 MED ORDER — DEXAMETHASONE SODIUM PHOSPHATE 10 MG/ML IJ SOLN
INTRAMUSCULAR | Status: AC
  Filled 2018-01-18: qty 1

## 2018-01-18 MED ORDER — PHENYLEPHRINE 40 MCG/ML (10ML) SYRINGE FOR IV PUSH (FOR BLOOD PRESSURE SUPPORT)
PREFILLED_SYRINGE | INTRAVENOUS | Status: AC
  Filled 2018-01-18: qty 20

## 2018-01-18 MED ORDER — ROCURONIUM BROMIDE 50 MG/5ML IV SOSY
PREFILLED_SYRINGE | INTRAVENOUS | Status: DC | PRN
  Administered 2018-01-18: 50 mg via INTRAVENOUS

## 2018-01-18 MED ORDER — OXYCODONE HCL 5 MG PO TABS
ORAL_TABLET | ORAL | Status: AC
  Filled 2018-01-18: qty 1

## 2018-01-18 MED ORDER — LIDOCAINE 2% (20 MG/ML) 5 ML SYRINGE
INTRAMUSCULAR | Status: AC
  Filled 2018-01-18: qty 5

## 2018-01-18 MED ORDER — EPHEDRINE 5 MG/ML INJ
INTRAVENOUS | Status: AC
  Filled 2018-01-18: qty 20

## 2018-01-18 MED ORDER — BUPIVACAINE LIPOSOME 1.3 % IJ SUSP
20.0000 mL | INTRAMUSCULAR | Status: DC
  Filled 2018-01-18: qty 20

## 2018-01-18 MED ORDER — VANCOMYCIN HCL 1000 MG IV SOLR
INTRAVENOUS | Status: DC | PRN
  Administered 2018-01-18: 1000 mg via INTRAVENOUS

## 2018-01-18 MED ORDER — BUPIVACAINE-EPINEPHRINE (PF) 0.25% -1:200000 IJ SOLN
INTRAMUSCULAR | Status: AC
  Filled 2018-01-18: qty 30

## 2018-01-18 MED ORDER — FENTANYL CITRATE (PF) 100 MCG/2ML IJ SOLN
INTRAMUSCULAR | Status: DC | PRN
  Administered 2018-01-18: 50 ug via INTRAVENOUS
  Administered 2018-01-18: 75 ug via INTRAVENOUS

## 2018-01-18 MED ORDER — POVIDONE-IODINE 7.5 % EX SOLN: Freq: Once | CUTANEOUS | Status: DC

## 2018-01-18 MED ORDER — MIDAZOLAM HCL 5 MG/5ML IJ SOLN
INTRAMUSCULAR | Status: DC | PRN
  Administered 2018-01-18 (×2): 1 mg via INTRAVENOUS

## 2018-01-18 MED ORDER — DIAZEPAM 5 MG PO TABS
5.0000 mg | ORAL_TABLET | Freq: Once | ORAL | Status: AC
  Administered 2018-01-18: 5 mg via ORAL

## 2018-01-18 MED ORDER — MIDAZOLAM HCL 2 MG/2ML IJ SOLN
INTRAMUSCULAR | Status: AC
  Filled 2018-01-18: qty 2

## 2018-01-18 MED ORDER — PROPOFOL 10 MG/ML IV BOLUS
INTRAVENOUS | Status: AC
  Filled 2018-01-18: qty 20

## 2018-01-18 MED ORDER — LIDOCAINE 2% (20 MG/ML) 5 ML SYRINGE
INTRAMUSCULAR | Status: DC | PRN
  Administered 2018-01-18: 40 mg via INTRAVENOUS

## 2018-01-18 MED ORDER — DIAZEPAM 5 MG PO TABS
ORAL_TABLET | ORAL | Status: AC
  Filled 2018-01-18: qty 1

## 2018-01-18 MED ORDER — PROPOFOL 500 MG/50ML IV EMUL
INTRAVENOUS | Status: AC
  Filled 2018-01-18: qty 50

## 2018-01-18 MED ORDER — LACTATED RINGERS IV SOLN
INTRAVENOUS | Status: DC | PRN
  Administered 2018-01-18: 10:00:00 via INTRAVENOUS

## 2018-01-18 MED ORDER — EPHEDRINE SULFATE-NACL 50-0.9 MG/10ML-% IV SOSY
PREFILLED_SYRINGE | INTRAVENOUS | Status: DC | PRN
  Administered 2018-01-18: 10 mg via INTRAVENOUS

## 2018-01-18 MED ORDER — FENTANYL CITRATE (PF) 100 MCG/2ML IJ SOLN: 25.0000 ug | INTRAMUSCULAR | Status: DC | PRN

## 2018-01-18 MED ORDER — FENTANYL CITRATE (PF) 250 MCG/5ML IJ SOLN
INTRAMUSCULAR | Status: AC
  Filled 2018-01-18: qty 5

## 2018-01-18 MED ORDER — ONDANSETRON HCL 4 MG/2ML IJ SOLN: 4.0000 mg | Freq: Once | INTRAMUSCULAR | Status: DC | PRN

## 2018-01-18 MED ORDER — PHENYLEPHRINE 40 MCG/ML (10ML) SYRINGE FOR IV PUSH (FOR BLOOD PRESSURE SUPPORT)
PREFILLED_SYRINGE | INTRAVENOUS | Status: DC | PRN
  Administered 2018-01-18 (×3): 80 ug via INTRAVENOUS

## 2018-01-18 MED ORDER — ONDANSETRON HCL 4 MG/2ML IJ SOLN
INTRAMUSCULAR | Status: AC
  Filled 2018-01-18: qty 2

## 2018-01-18 MED ORDER — PROPOFOL 1000 MG/100ML IV EMUL
INTRAVENOUS | Status: AC
  Filled 2018-01-18: qty 200

## 2018-01-18 MED ORDER — OXYCODONE HCL 5 MG/5ML PO SOLN: 5.0000 mg | Freq: Once | ORAL | Status: AC | PRN

## 2018-01-18 SURGICAL SUPPLY — 61 items
APL SKNCLS STERI-STRIP NONHPOA (GAUZE/BANDAGES/DRESSINGS) ×1
BENZOIN TINCTURE PRP APPL 2/3 (GAUZE/BANDAGES/DRESSINGS) ×2 IMPLANT
BLADE CLIPPER SURG (BLADE) ×1 IMPLANT
BLADE SURG 10 STRL SS (BLADE) ×2 IMPLANT
CANISTER SUCT 3000ML PPV (MISCELLANEOUS) ×2 IMPLANT
COVER SURGICAL LIGHT HANDLE (MISCELLANEOUS) ×4 IMPLANT
COVER WAND RF STERILE (DRAPES) ×2 IMPLANT
DRAPE C-ARM 42X72 X-RAY (DRAPES) ×2 IMPLANT
DRAPE C-ARMOR (DRAPES) ×2 IMPLANT
DRAPE INCISE IOBAN 66X45 STRL (DRAPES) ×2 IMPLANT
DRAPE POUCH INSTRU U-SHP 10X18 (DRAPES) ×2 IMPLANT
DRAPE SURG 17X23 STRL (DRAPES) ×6 IMPLANT
DURAPREP 26ML APPLICATOR (WOUND CARE) ×2 IMPLANT
ELECT CAUTERY BLADE 6.4 (BLADE) ×2 IMPLANT
ELECT REM PT RETURN 9FT ADLT (ELECTROSURGICAL) ×2
ELECTRODE REM PT RTRN 9FT ADLT (ELECTROSURGICAL) ×1 IMPLANT
GAUZE 4X4 16PLY RFD (DISPOSABLE) ×2 IMPLANT
GAUZE SPONGE 4X4 12PLY STRL (GAUZE/BANDAGES/DRESSINGS) ×2 IMPLANT
GLOVE BIO SURGEON STRL SZ 6.5 (GLOVE) ×1 IMPLANT
GLOVE BIO SURGEON STRL SZ7 (GLOVE) ×2 IMPLANT
GLOVE BIO SURGEON STRL SZ8 (GLOVE) ×2 IMPLANT
GLOVE BIOGEL PI IND STRL 6.5 (GLOVE) IMPLANT
GLOVE BIOGEL PI IND STRL 7.0 (GLOVE) ×1 IMPLANT
GLOVE BIOGEL PI IND STRL 8 (GLOVE) ×1 IMPLANT
GLOVE BIOGEL PI INDICATOR 6.5 (GLOVE) ×1
GLOVE BIOGEL PI INDICATOR 7.0 (GLOVE) ×1
GLOVE BIOGEL PI INDICATOR 8 (GLOVE) ×1
GOWN STRL REUS W/ TWL LRG LVL3 (GOWN DISPOSABLE) ×2 IMPLANT
GOWN STRL REUS W/ TWL XL LVL3 (GOWN DISPOSABLE) ×1 IMPLANT
GOWN STRL REUS W/TWL LRG LVL3 (GOWN DISPOSABLE) ×4
GOWN STRL REUS W/TWL XL LVL3 (GOWN DISPOSABLE) ×2
IMPL IFUSE 7.0MMX45MM (Rod) IMPLANT
IMPL IFUSE 7.0X55MM (Rod) IMPLANT
IMPLANT IFUSE 7.0MMX45MM (Rod) ×4 IMPLANT
IMPLANT IFUSE 7.0X55MM (Rod) ×2 IMPLANT
KIT BASIN OR (CUSTOM PROCEDURE TRAY) ×2 IMPLANT
KIT TURNOVER KIT B (KITS) ×2 IMPLANT
MANIFOLD NEPTUNE II (INSTRUMENTS) ×2 IMPLANT
NDL HYPO 25GX1X1/2 BEV (NEEDLE) ×1 IMPLANT
NEEDLE 22X1 1/2 (OR ONLY) (NEEDLE) ×1 IMPLANT
NEEDLE HYPO 25GX1X1/2 BEV (NEEDLE) ×2 IMPLANT
NS IRRIG 1000ML POUR BTL (IV SOLUTION) ×2 IMPLANT
PACK UNIVERSAL I (CUSTOM PROCEDURE TRAY) ×2 IMPLANT
PAD ARMBOARD 7.5X6 YLW CONV (MISCELLANEOUS) ×4 IMPLANT
PENCIL BUTTON HOLSTER BLD 10FT (ELECTRODE) ×2 IMPLANT
SPONGE LAP 18X18 X RAY DECT (DISPOSABLE) ×2 IMPLANT
STAPLER VISISTAT 35W (STAPLE) ×2 IMPLANT
STRIP CLOSURE SKIN 1/2X4 (GAUZE/BANDAGES/DRESSINGS) ×2 IMPLANT
SUT MNCRL AB 4-0 PS2 18 (SUTURE) ×2 IMPLANT
SUT VIC AB 0 CT1 18XCR BRD 8 (SUTURE) IMPLANT
SUT VIC AB 0 CT1 8-18 (SUTURE)
SUT VIC AB 1 CT1 18XCR BRD 8 (SUTURE) ×1 IMPLANT
SUT VIC AB 1 CT1 8-18 (SUTURE) ×2
SUT VIC AB 2-0 CT2 18 VCP726D (SUTURE) ×2 IMPLANT
SYR BULB IRRIGATION 50ML (SYRINGE) ×4 IMPLANT
SYR CONTROL 10ML LL (SYRINGE) ×2 IMPLANT
TOWEL OR 17X24 6PK STRL BLUE (TOWEL DISPOSABLE) ×2 IMPLANT
TOWEL OR 17X26 10 PK STRL BLUE (TOWEL DISPOSABLE) ×4 IMPLANT
TUBE CONNECTING 12X1/4 (SUCTIONS) ×2 IMPLANT
WATER STERILE IRR 1000ML POUR (IV SOLUTION) ×2 IMPLANT
YANKAUER SUCT BULB TIP NO VENT (SUCTIONS) ×2 IMPLANT

## 2018-01-18 NOTE — Anesthesia Preprocedure Evaluation (Addendum)
Anesthesia Evaluation  Patient identified by MRN, date of birth, ID band Patient awake    Reviewed: Allergy & Precautions, NPO status , Patient's Chart, lab work & pertinent test results  Airway Mallampati: II  TM Distance: >3 FB Neck ROM: Full    Dental  (+) Teeth Intact, Dental Advisory Given   Pulmonary Current Smoker,    breath sounds clear to auscultation       Cardiovascular  Rhythm:Regular Rate:Normal     Neuro/Psych    GI/Hepatic   Endo/Other    Renal/GU      Musculoskeletal   Abdominal   Peds  Hematology   Anesthesia Other Findings   Reproductive/Obstetrics                             Anesthesia Physical Anesthesia Plan  ASA: II  Anesthesia Plan: General   Post-op Pain Management:    Induction: Intravenous  PONV Risk Score and Plan: Ondansetron and Dexamethasone  Airway Management Planned: Oral ETT  Additional Equipment:   Intra-op Plan:   Post-operative Plan: Extubation in OR  Informed Consent: I have reviewed the patients History and Physical, chart, labs and discussed the procedure including the risks, benefits and alternatives for the proposed anesthesia with the patient or authorized representative who has indicated his/her understanding and acceptance.     Dental advisory given  Plan Discussed with: CRNA and Anesthesiologist  Anesthesia Plan Comments:         Anesthesia Quick Evaluation  

## 2018-01-18 NOTE — H&P (Signed)
PREOPERATIVE H&P  Chief Complaint: Left low back pain  HPI: Amy Ingram is a 33 y.o. female who presents with ongoing pain in the left low back  The patient's exam and history is c/w with left SI dysfunction.   Patient has failed multiple forms of conservative care and continues to have pain (see office notes for additional details regarding the patient's full course of treatment)  Past Medical History:  Diagnosis Date  . Allergy    Past Surgical History:  Procedure Laterality Date  . DENTAL SURGERY  2006   dental implants 2006   Social History   Socioeconomic History  . Marital status: Single    Spouse name: Not on file  . Number of children: Not on file  . Years of education: Not on file  . Highest education level: Not on file  Occupational History  . Not on file  Social Needs  . Financial resource strain: Not on file  . Food insecurity:    Worry: Not on file    Inability: Not on file  . Transportation needs:    Medical: Not on file    Non-medical: Not on file  Tobacco Use  . Smoking status: Current Every Day Smoker    Packs/day: 0.50    Years: 10.00    Pack years: 5.00    Types: Cigarettes  . Smokeless tobacco: Never Used  Substance and Sexual Activity  . Alcohol use: Yes  . Drug use: No  . Sexual activity: Not on file  Lifestyle  . Physical activity:    Days per week: Not on file    Minutes per session: Not on file  . Stress: Not on file  Relationships  . Social connections:    Talks on phone: Not on file    Gets together: Not on file    Attends religious service: Not on file    Active member of club or organization: Not on file    Attends meetings of clubs or organizations: Not on file    Relationship status: Not on file  Other Topics Concern  . Not on file  Social History Narrative  . Not on file   No family history on file. No Known Allergies Prior to Admission medications   Medication Sig Start Date End Date Taking? Authorizing  Provider  methocarbamol (ROBAXIN) 500 MG tablet Take 1 tablet (500 mg total) by mouth 2 (two) times daily. Patient taking differently: Take 500 mg by mouth 2 (two) times daily as needed for muscle spasms.  06/02/17  Yes Palumbo, April, MD  naproxen sodium (ALEVE) 220 MG tablet Take 220 mg by mouth daily as needed (pain).   Yes [provider]  cyclobenzaprine (FLEXERIL) 10 MG tablet Take 1 tablet (10 mg total) by mouth 3 (three) times daily as needed for muscle spasms. Patient not taking: Reported on 01/15/2018 12/17/13   Bennie Pierini, FNP  Diclofenac Sodium CR (VOLTAREN-XR) 100 MG 24 hr tablet Take 1 tablet (100 mg total) by mouth daily. Patient not taking: Reported on 01/15/2018 06/02/17   Nicanor Alcon, April, MD  meclizine (ANTIVERT) 25 MG tablet Take 1 tablet (25 mg total) by mouth 3 (three) times daily as needed for dizziness. Patient not taking: Reported on 01/15/2018 07/29/13   Deatra Canter, FNP  predniSONE (DELTASONE) 20 MG tablet 3 tabs po day one, then 2 po daily x 4 days Patient not taking: Reported on 01/15/2018 06/02/17   Nicanor Alcon, April, MD  All other systems have been reviewed and were otherwise negative with the exception of those mentioned in the HPI and as above.  Physical Exam: There were no vitals filed for this visit.  There is no height or weight on file to calculate BMI.  General: Alert, no acute distress Cardiovascular: No pedal edema Respiratory: No cyanosis, no use of accessory musculature Skin: No lesions in the area of chief complaint Neurologic: Sensation intact distally Psychiatric: Patient is competent for consent with normal mood and affect Lymphatic: No axillary or cervical lymphadenopathy  MUSCULOSKELETAL: + TTP left low back  Assessment/Plan: ONGOING LEFT SIDED SACROILIAC JOINT DYSFUNCTION Plan for Procedure(s): LEFT SACROILIAC JOINT FUSION   Jackelyn Hoehn, MD 01/18/2018 8:06 AM

## 2018-01-18 NOTE — Anesthesia Postprocedure Evaluation (Signed)
Anesthesia Post Note  Patient: Amy Ingram  Procedure(s) Performed: LEFT SACROILIAC JOINT FUSION (Left )     Patient location during evaluation: PACU Anesthesia Type: General Level of consciousness: awake and alert Pain management: pain level controlled Vital Signs Assessment: post-procedure vital signs reviewed and stable Respiratory status: spontaneous breathing, nonlabored ventilation, respiratory function stable and patient connected to nasal cannula oxygen Cardiovascular status: blood pressure returned to baseline and stable Postop Assessment: no apparent nausea or vomiting Anesthetic complications: no    Last Vitals:  Vitals:   01/18/18 1345 01/18/18 1347  BP:    Pulse: 80 85  Resp:  20  Temp:  (!) 36.4 C  SpO2: 100% 100%    Last Pain:  Vitals:   01/18/18 1347  TempSrc:   PainSc: 2                  Leamon Palau COKER

## 2018-01-18 NOTE — Transfer of Care (Signed)
Immediate Anesthesia Transfer of Care Note  Patient: Amy Ingram  Procedure(s) Performed: LEFT SACROILIAC JOINT FUSION (Left )  Patient Location: PACU  Anesthesia Type:General  Level of Consciousness: awake, alert , oriented and sedated  Airway & Oxygen Therapy: Patient Spontanous Breathing and Patient connected to nasal cannula oxygen  Post-op Assessment: Report given to RN, Post -op Vital signs reviewed and stable and Patient moving all extremities  Post vital signs: Reviewed and stable  Last Vitals:  Vitals Value Taken Time  BP 133/91 01/18/2018  1:00 PM  Temp 36.7 C 01/18/2018 12:57 PM  Pulse 104 01/18/2018  1:04 PM  Resp 14 01/18/2018  1:04 PM  SpO2 100 % 01/18/2018  1:04 PM  Vitals shown include unvalidated device data.  Last Pain:  Vitals:   01/18/18 0906  TempSrc:   PainSc: 5       Patients Stated Pain Goal: 3 (01/18/18 0906)  Complications: No apparent anesthesia complications

## 2018-01-18 NOTE — Op Note (Signed)
NAME:  Amy Ingram          MEDICAL RECORD NO.:  [id number]  PHYSICIAN:  Estill Bamberg, MD      DATE OF BIRTH:  1985-06-10  DATE OF PROCEDURE:  01/18/2018                              OPERATIVE REPORT   PREOPERATIVE DIAGNOSES: 1.  Left sacroiliac joint dysfunction  POSTOPERATIVE DIAGNOSES: 1.  Left sacroiliac joint dysfunction  PROCEDURES: Minimally invasive left sacroiliac joint fusion  SURGEON:  Estill Bamberg, MD.  ASSISTANTJason Coop, PA-C.  ANESTHESIA:  General endotracheal anesthesia.  COMPLICATIONS:  None.  DISPOSITION:  Stable.  ESTIMATED BLOOD LOSS:  Minimal.  INDICATIONS FOR SURGERY:  Briefly, [name] is a pleasant 33 year old female, who did present to me with ongoing severe pain at the left side of her low back, status post a work injury.  Her work-up was diagnostic for left sacroiliac joint dysfunction.  She did fail appropriate conservative treatment measures, and as such, we did discuss proceeding with the surgery noted above.  The patient was made fully aware of the risks and recovery period associated with surgery, and she did wish to proceed.  OPERATIVE DETAILS:  On 01/18/2018, the patient was brought to surgery and general endotracheal anesthesia was administered.  The patient was placed prone on a flat Jackson bed, withdrawal was placed beneath the patient's chest and hips.  The region of the left buttock was prepped and draped in the usual sterile fashion.  A timeout procedure was performed.  Fluoroscopy was brought into the field.  I was able to ensure adequate lateral, inlet, and outlet radiographs.  At this point, a 3 cm incision was made in line with the posterior border of the sacrum on the left.  3 guidewires were advanced across the sacroiliac joint on the left side.  Fluoroscopy was liberally used while advancing the guidewires in order to ensure a safe trajectory of the guidewires..  I then drilled and broached over the guidewires.   At this point, 7 mm implants were advanced across the sacroiliac joints from superiorly to inferiorly, the length of the implants were 55 mm, 45 mm, and 45 mm.  I was very pleased with the resting position of the implants on lateral, inlet, and outlet fluoroscopy.  The guidewires were then removed.  I was very pleased with the final lateral, inlet, and outlet radiographs.  At this point, the wound was copiously irrigated and closed in layers, using #1 Vicryl, followed by 2-0 Vicryl, followed by 4-0 Monocryl.  All instrument counts were correct at the termination of the procedure.   Of note, Jason Coop was my assistant throughout surgery, and did aid in retraction, suctioning, and closure from start to finish.   Estill Bamberg, MD

## 2018-01-18 NOTE — Anesthesia Procedure Notes (Signed)
Procedure Name: Intubation Date/Time: 01/18/2018 11:31 AM Performed by: Fransisca KaufmannMeyer, Seanpaul Preece E, CRNA Pre-anesthesia Checklist: Patient identified, Emergency Drugs available, Suction available and Patient being monitored Patient Re-evaluated:Patient Re-evaluated prior to induction Oxygen Delivery Method: Circle System Utilized Preoxygenation: Pre-oxygenation with 100% oxygen Induction Type: IV induction Ventilation: Mask ventilation without difficulty Grade View: Grade II Tube type: Oral Tube size: 7.0 mm Number of attempts: 1 Airway Equipment and Method: Stylet and Oral airway Placement Confirmation: ETT inserted through vocal cords under direct vision,  positive ETCO2 and breath sounds checked- equal and bilateral Secured at: 21 cm Tube secured with: Tape Dental Injury: Teeth and Oropharynx as per pre-operative assessment

## 2018-01-21 ENCOUNTER — Encounter (HOSPITAL_COMMUNITY): Payer: Self-pay | Admitting: Orthopedic Surgery

## 2018-03-01 ENCOUNTER — Other Ambulatory Visit: Payer: Self-pay

## 2018-03-01 ENCOUNTER — Encounter: Payer: Self-pay | Admitting: Physical Therapy

## 2018-03-01 ENCOUNTER — Ambulatory Visit: Payer: PRIVATE HEALTH INSURANCE | Attending: Orthopedic Surgery | Admitting: Physical Therapy

## 2018-03-01 DIAGNOSIS — M6281 Muscle weakness (generalized): Secondary | ICD-10-CM | POA: Diagnosis present

## 2018-03-01 DIAGNOSIS — M5442 Lumbago with sciatica, left side: Secondary | ICD-10-CM | POA: Diagnosis present

## 2018-03-01 DIAGNOSIS — R252 Cramp and spasm: Secondary | ICD-10-CM | POA: Insufficient documentation

## 2018-03-01 NOTE — Therapy (Signed)
[facility] [address], [address] [place], [place], [address] Phone: [phone]   Fax:  [phone]  Physical Therapy Evaluation  Patient Details  Name: Amy Ingram MRN: [id number] Date of Birth: [birth year] Referring Provider (PT): Dr. Estill BambergMark Dumonski   Encounter Date: 03/01/2018  PT End of Session - 03/01/18 0903    Visit Number  1    Date for PT Re-Evaluation  04/12/18    Authorization Type  Workers comp-1 eval and 8 treatments    Authorization - Visit Number  1    Authorization - Number of Visits  9    PT Start Time  0800    PT Stop Time  0845    PT Time Calculation (min)  45 min    Activity Tolerance  Patient tolerated treatment well;No increased pain    Behavior During Therapy  WFL for tasks assessed/performed       Past Medical History:  Diagnosis Date  . Allergy     Past Surgical History:  Procedure Laterality Date  . DENTAL SURGERY  2006   dental implants 2006  . SACROILIAC JOINT FUSION Left 01/18/2018   Procedure: LEFT SACROILIAC JOINT FUSION;  Surgeon: Estill Bambergumonski, Mark, MD;  Location: [facility] OR;  Service: Orthopedics;  Laterality: Left;    There were no vitals filed for this visit.   Subjective Assessment - 03/01/18 0803    Subjective  Patient initially injured her Left SI joint at work. Patient had SI joint fusion on 01/18/2018. Patient was on crutches with non-wt bearing for 2 weeks, then 50% wt bear for several weeks. Patient is now full weight bear. When patient does heel toe on left feel tingling in left heel. Patient is not able to put full weight on left leg.     Patient Stated Goals  strengthening, improve ROM, get back to normal    Currently in Pain?  Yes    Pain Score  5     Pain Location  Sacrum    Pain Orientation  Left    Pain Radiating Towards  to left knee    Pain Onset  More than a month ago    Pain Frequency  Constant   left leg is intermittent   Aggravating Factors   walking, sitting a  certain way, rolling in bed, getting out of car, bending over    Pain Relieving Factors  valium at night, ice, heat,     Multiple Pain Sites  No         [facility] PT Assessment - 03/01/18 0001      Assessment   Medical Diagnosis  s/p left SI joint fusion    Referring Provider (PT)  Dr. Estill BambergMark Dumonski    Onset Date/Surgical Date  01/18/18    Prior Therapy  prior to surgery      Precautions   Precautions  Other (comment)    Precaution Comments  no lifting over 10#, work is desk duty      Restrictions   Weight Bearing Restrictions  No      Balance Screen   Has the patient fallen in the past 6 months  No    Has the patient had a decrease in activity level because of a fear of falling?   No    Is the patient reluctant to leave their home because of a fear of falling?   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    Leisure  gym, riding horses      Cognition   Overall Cognitive Status  Within Functional Limits for tasks assessed      Observation/Other Assessments   Skin Integrity  scar on left hip from surgery has decreased mobility and is thick    Focus on Therapeutic Outcomes (FOTO)   patient limited by 60% as per therapist discretion due to difficulty walking, sitting, daily activities      Posture/Postural Control   Posture/Postural Control  Postural limitations    Posture Comments  most weight on right hip, k=keeps left knee slightly extended      ROM / Strength   AROM / PROM / Strength  AROM;PROM;Strength      AROM   Left  Hip ABduction  10    Lumbar Flexion  limited by 25%    Lumbar Extension  limited by 75%    Lumbar - Right Side Bend  limited by 25%    Lumbar - Left Side Bend  limited by 25%    Lumbar - Right Rotation  full    Lumbar - Left Rotation  limited by 25%      PROM   Left Hip Extension  full    Left Hip Flexion  100    Left Hip External Rotation   28    Left Hip Internal Rotation   25    Left Hip ABduction  12      Strength   Left Hip Extension  3/5    Left Hip External Rotation  4/5    Left Hip Internal Rotation  4/5    Left Hip ABduction  2-/5    Right Knee Flexion  4+/5    Right Ankle Eversion  4-/5    Left Ankle Dorsiflexion  3+/5      Palpation   SI assessment   left ilium is higher    Palpation comment  left gluteals, left lumbar paraspinals, left hamstring, left levator ani and obturator internist, left coccygeus, left TFL      Ambulation/Gait   Ambulation/Gait  Yes    Gait Pattern  Decreased stance time - left;Decreased weight shift to left;Decreased dorsiflexion - left;Left hip hike    Stairs  Yes    Stairs Assistance  6: Modified independent (Device/Increase time)    Stair Management Technique  Step to pattern   leading with right               Objective measurements completed on examination: See above findings.    Pelvic Floor Special Questions - 03/01/18 0001    Urinary Leakage  No    Fecal incontinence  No       [facility] Adult PT Treatment/Exercise - 03/01/18 0001      Therapeutic Activites    Therapeutic Activities  ADL's    ADL's  rolling in bed with abdominal bracing, supine to sit with abdominal bracing, sit to  stand with abdominal bracing      Modalities   Modalities  Iontophoresis      Iontophoresis   Type of Iontophoresis  Dexamethasone    Location  left lower SI joint    Dose  1 ml, #1    Time  6 hour             PT Education - 03/01/18 0851    Education Details  Access Code: [email or web address] ; information on iotophoresis     Person(s) Educated  Patient    Methods  Explanation;Demonstration;Verbal cues;Handout    Comprehension  Returned demonstration;Verbalized understanding       PT Short Term Goals - 03/01/18 0926      PT SHORT TERM GOAL #1   Title  independent with initial HEP    Baseline  --    Time  3    Period  Weeks    Status  New    Target Date  03/22/18      PT SHORT TERM GOAL #2   Title  ability to ambulate with heel toe gait on the left with no tingling in left leg    Baseline  --    Time  3    Period  Weeks    Status  New    Target Date  03/22/18      PT SHORT TERM GOAL #3   Title  ability to perform transitional movements with pain level decreased >/= 25% due to engaging the core and pelvic floor    Baseline  --    Time  3    Period  Weeks    Status  New    Target Date  03/22/18      PT SHORT TERM GOAL #4   Title  ---    Baseline  --      PT SHORT TERM GOAL #5   Title  --    Baseline  --        PT Long Term Goals - 03/01/18 0973      PT LONG TERM GOAL #1   Title  independent with initial HEP    Time  6    Period  Weeks    Status  New    Target Date  04/12/18      PT LONG TERM GOAL #2   Title  ability to sit with equal weight on bilateral buttocks due to pain decreased >/= 75%    Time  6    Period  Weeks    Status  New    Target Date  04/12/18      PT LONG TERM GOAL #3   Title  able to walk in the community wiht minimal to no gait deficits and pain decreased >/= 75% and full weight on left leg    Time  6    Period  Weeks    Status  New    Target Date  04/12/18      PT LONG TERM GOAL #4   Title  left hip strength 4+/5 so patient is able to go up and down stairs with step over step pattern    Time  6    Period  Weeks    Status  New    Target Date  04/12/18      PT LONG TERM GOAL #5   Title  ability to squat and lift within her restrictions from the doctor with correct body mechanics and due to increased hip strength >/=  4+/5    Time  6    Period  Weeks     Status  New    Target Date  04/12/18             Plan - 03/01/18 0904    Clinical Impression Statement  Patient is a 33 year old female s/p left SI joint fusion on 01/18/2018. 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 Patient is on weight restriction of lifting 10#. 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 Patient Patient reports constant pain at level 5/10 in left SI joint and intermittent in left thigh. Patient pain increases with transitional movements, sitting, walking, getting out of car and bending over. When patient walks heel toe she will get tingling in left heel. Patient ambulates with decreased weightbear on the left, decreased dorsiflexon on the left, decreased left hip extension, and hikes left hip. Patient will sit with majority of her weight on right hip and left knee slightly extended. Lumbar ROM is limited by 25% and extension is limited by 75%. Left hip P/ROM is limited.  Left hip strength is 3-2/5 strength, knee flexion 4+/5, and ankle dorsiflexopm 3+/5, and ankle eversion 4-/5. Patient has tenderness located along the left SI joint, left gluteals, left hamstring, left TFL, and left lumbar area. Patient will benefit from skilled therapy to improve movement, strength and reduce pain to return to prior function and full duty at work.     History and Personal Factors relevant to plan of care:  Left SI joint fusion 01/18/2018 with 10# lifting limit    Clinical Presentation  Evolving    Clinical Presentation due to:  not able to return to prior job, walks with deficits, moving in bed with pain, shower with shower chair    Clinical Decision Making  Low    Rehab Potential  Excellent    Clinical Impairments Affecting Rehab Potential  Left SI joint fusion 01/18/2018 with 10# lifting limit    PT Frequency  2x / week   first week is 1 time for initial evaluation   PT Duration  6 weeks    PT Treatment/Interventions  Cryotherapy;Electrical  Stimulation;Iontophoresis /ml Dexamethasone;Ultrasound;Manual techniques;Gait training;Therapeutic activities;Therapeutic exercise;Neuromuscular re-education;Balance training;Patient/family education;Passive range of motion;Dry needling;Scar mobilization;Taping   TENs unit   PT Next Visit Plan  see if iont helped, dry needling to lumbar, gluteals, coccygeus,; weight shifting task, core stabilization, hip abduction A/AROM; stretches    Consulted and Agree with Plan of Care  Patient       Patient will benefit from skilled therapeutic intervention in order to improve the following deficits and impairments:  Abnormal gait, Increased fascial restricitons, Pain, Decreased coordination, Decreased mobility, Increased muscle spasms, Postural dysfunction, Decreased activity tolerance, Decreased endurance, Decreased range of motion, Decreased strength, Difficulty walking  Visit Diagnosis: Muscle weakness (generalized) - Plan: PT plan of care cert/re-cert  Cramp and spasm - Plan: PT plan of care cert/re-cert  Acute left-sided low back pain with left-sided sciatica - Plan: PT plan of care cert/re-cert     Problem List There are no active problems to display for this patient.   Eulis Foster, PT 03/01/18 10:35 AM   [facility] [address], [address] [place], [place], [address] Phone: [phone]   Fax:  [phone]  Name: Amy Ingram MRN: [id number] Date of Birth: 06/06/1985

## 2018-03-01 NOTE — Patient Instructions (Signed)
IONTOPHORESIS PATIENT PRECAUTIONS & CONTRAINDICATIONS:  . Redness under one or both electrodes can occur.  This characterized by a uniform redness that usually disappears within 12 hours of treatment. . Small pinhead size blisters may result in response to the drug.  Contact your physician if the problem persists more than 24 hours. . On rare occasions, iontophoresis therapy can result in temporary skin reactions such as rash, inflammation, irritation or burns.  The skin reactions may be the result of individual sensitivity to the ionic solution used, the condition of the skin at the start of treatment, reaction to the materials in the electrodes, allergies or sensitivity to dexamethasone, or a poor connection between the patch and your skin.  Discontinue using iontophoresis if you have any of these reactions and report to your therapist. . Remove the Patch or electrodes if you have any undue sensation of pain or burning during the treatment and report discomfort to your therapist. . Tell your Therapist if you have had known adverse reactions to the application of electrical current. . If using the Patch, the LED light will turn off when treatment is complete and the patch can be removed.  Approximate treatment time is 1-3 hours.  Remove the patch when light goes off or after 6 hours. . The Patch can be worn during normal activity, however excessive motion where the electrodes have been placed can cause poor contact between the skin and the electrode or uneven electrical current resulting in greater risk of skin irritation. Marland Kitchen Keep out of the reach of children.   . DO NOT use if you have a cardiac pacemaker or any other electrically sensitive implanted device. . DO NOT use if you have a known sensitivity to dexamethasone. . DO NOT use during Magnetic Resonance Imaging (MRI). . DO NOT use over broken or compromised skin (e.g. sunburn, cuts, or acne) due to the increased risk of skin reaction. . DO  NOT SHAVE over the area to be treated:  To establish good contact between the Patch and the skin, excessive hair may be clipped. . DO NOT place the Patch or electrodes on or over your eyes, directly over your heart, or brain. . DO NOT reuse the Patch or electrodes as this may cause burns to occur. [facility] [address], [address] [place], [place] [address] Phone # [phone] Fax [phone] Access Code: [email or web address]  URL: [email or web address]  Date: 03/01/2018  Prepared by: Eulis Foster   Exercises  Hooklying Gluteal Sets - 10 reps - 1 sets - 5 sec hold - 2x daily - 7x weekly  Sidelying Transversus Abdominis Bracing - 10 reps - 1 sets - 5 sec hold - 1x daily - 7x weekly  Supine Transversus Abdominis Bracing - Hands on Stomach - 10 reps - 1 sets - 5sec hold - 1x daily - 7x weekly

## 2018-03-06 ENCOUNTER — Encounter: Payer: Self-pay | Admitting: Physical Therapy

## 2018-03-06 ENCOUNTER — Ambulatory Visit: Payer: PRIVATE HEALTH INSURANCE | Attending: Orthopedic Surgery | Admitting: Physical Therapy

## 2018-03-06 DIAGNOSIS — M6281 Muscle weakness (generalized): Secondary | ICD-10-CM

## 2018-03-06 DIAGNOSIS — R252 Cramp and spasm: Secondary | ICD-10-CM

## 2018-03-06 DIAGNOSIS — M5442 Lumbago with sciatica, left side: Secondary | ICD-10-CM | POA: Insufficient documentation

## 2018-03-06 NOTE — Patient Instructions (Signed)
Access Code: [email or web address]  URL: [email or web address]  Date: 03/06/2018  Prepared by: Eulis Foster   Exercises  Prone Hip Internal Rotation AROM - 10 reps - 1 sets - 2x daily - 7x weekly  Prone Gluteal Sets - 10 reps - 1 sets - 5 sec hold - 1x daily - 7x weekly  Hooklying Transversus Abdominis Palpation - 10 reps - 1 sets - 5 sec hold - 2x daily - 7x weekly  Supine Hip Adduction Isometric with Ball - 10 reps - 1 sets - 5 sec hold - 2x daily - 7x weekly  Trigger Point Dry Needling  . What is Trigger Point Dry Needling (DN)? o DN is a physical therapy technique used to treat muscle pain and dysfunction. Specifically, DN helps deactivate muscle trigger points (muscle knots).  o A thin filiform needle is used to penetrate the skin and stimulate the underlying trigger point. The goal is for a local twitch response (LTR) to occur and for the trigger point to relax. No medication of any kind is injected during the procedure.   . What Does Trigger Point Dry Needling Feel Like?  o The procedure feels different for each individual patient. Some patients report that they do not actually feel the needle enter the skin and overall the process is not painful. Very mild bleeding may occur. However, many patients feel a deep cramping in the muscle in which the needle was inserted. This is the local twitch response.   Marland Kitchen How Will I feel after the treatment? o Soreness is normal, and the onset of soreness may not occur for a few hours. Typically this soreness does not last longer than two days.  o Bruising is uncommon, however; ice can be used to decrease any possible bruising.  o In rare cases feeling tired or nauseous after the treatment is normal. In addition, your symptoms may get worse before they get better, this period will typically not last longer than 24 hours.   . What Can I do After My Treatment? o Increase your hydration by drinking more water for the next 24 hours. o You may place ice  or heat on the areas treated that have become sore, however, do not use heat on inflamed or bruised areas. Heat often brings more relief post needling. o You can continue your regular activities, but vigorous activity is not recommended initially after the treatment for 24 hours. o DN is best combined with other physical therapy such as strengthening, stretching, and other therapies.    [facility] [address], [address] [place], [place] [address] Phone # [phone] Fax [phone]

## 2018-03-06 NOTE — Therapy (Signed)
[facility] [address], [place], [place], [address] Phone: [phone]   Fax:  [phone]  Physical Therapy Treatment  Patient Details  Name: Amy Ingram MRN: [id number] Date of Birth: Jun 07, 1985 Referring Provider (PT): Dr. Phylliss Bob   Encounter Date: 03/06/2018  PT End of Session - 03/06/18 1314    Visit Number  2    Date for PT Re-Evaluation  04/12/18    Authorization Type  Workers comp-1 eval and 8 treatments    Authorization - Visit Number  2    Authorization - Number of Visits  9    PT Start Time  3664    PT Stop Time  1310    PT Time Calculation (min)  40 min    Activity Tolerance  Patient tolerated treatment well;No increased pain    Behavior During Therapy  WFL for tasks assessed/performed       Past Medical History:  Diagnosis Date  . Allergy     Past Surgical History:  Procedure Laterality Date  . DENTAL SURGERY  2006   dental implants 2006  . SACROILIAC JOINT FUSION Left 01/18/2018   Procedure: LEFT SACROILIAC JOINT FUSION;  Surgeon: Phylliss Bob, MD;  Location: [facility];  Service: Orthopedics;  Laterality: Left;    There were no vitals filed for this visit.  Subjective Assessment - 03/06/18 1236    Subjective  my left leg is giving me a fit. I felt a little sore after last visit. 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 The chair are upright.     Patient Stated Goals  strengthening, improve ROM, get back to normal    Currently in Pain?  Yes    Pain Score  5     Pain Location  Sacrum   left leg   Pain Orientation  Left    Pain Descriptors / Indicators  Aching;Sharp    Pain Type  Chronic pain    Pain Radiating Towards  to left knee    Pain Onset  More than a month ago    Pain Frequency  Constant   left leg is intermittent   Aggravating Factors   walking, sitting a certain way, rolling in bed, getting out of a car, bending over    Pain Relieving Factors  valium at  night, ice, heat    Multiple Pain Sites  No                       [facility] Adult PT Treatment/Exercise - 03/06/18 0001      Lumbar Exercises: Supine   Ab Set  10 reps;5 seconds    Other Supine Lumbar Exercises  hookly ball squeee hold 5 sec 10x      Lumbar Exercises: Prone   Other Prone Lumbar Exercises  gluteal squeezes hold 5 sec 10x; bil. hip ER/IR       Modalities   Modalities  Iontophoresis      Iontophoresis   Type of Iontophoresis  Dexamethasone    Location  left lower SI joint    Dose  1 ml, #2    Time  6 hour      Manual Therapy   Manual Therapy  Soft tissue mobilization    Soft tissue mobilization  lumbar paraspinals, gluteal, left levator ani, left ischial tuberosity, left hamstring, left hip adductors       Trigger Point Dry Needling - 03/06/18 0001  Consent Given?  Yes    Education Handout Provided  Yes    Muscles Treated Back/Hip  Gluteus minimus;Gluteus medius;Lumbar multifidi    Gluteus Minimus Response  Twitch response elicited;Palpable increased muscle length    Gluteus Medius Response  Twitch response elicited;Palpable increased muscle length    Lumbar multifidi Response  Twitch response elicited;Palpable increased muscle length           PT Education - 03/06/18 1311    Education Details  Access Code: [email or web address] ; dry needling information'    Person(s) Educated  Patient    Methods  Explanation;Demonstration;Verbal cues;Handout    Comprehension  Verbalized understanding;Returned demonstration       PT Short Term Goals - 03/01/18 0926      PT SHORT TERM GOAL #1   Title  independent with initial HEP    Baseline  --    Time  3    Period  Weeks    Status  New    Target Date  03/22/18      PT SHORT TERM GOAL #2   Title  ability to ambulate with heel toe gait on the left with no tingling in left leg    Baseline  --    Time  3    Period  Weeks    Status  New    Target Date  03/22/18      PT SHORT TERM GOAL #3   Title   ability to perform transitional movements with pain level decreased >/= 25% due to engaging the core and pelvic floor    Baseline  --    Time  3    Period  Weeks    Status  New    Target Date  03/22/18      PT SHORT TERM GOAL #4   Title  ---    Baseline  --      PT SHORT TERM GOAL #5   Title  --    Baseline  --        PT Long Term Goals - 03/01/18 5462      PT LONG TERM GOAL #1   Title  independent with initial HEP    Time  6    Period  Weeks    Status  New    Target Date  04/12/18      PT LONG TERM GOAL #2   Title  ability to sit with equal weight on bilateral buttocks due to pain decreased >/= 75%    Time  6    Period  Weeks    Status  New    Target Date  04/12/18      PT LONG TERM GOAL #3   Title  able to walk in the community wiht minimal to no gait deficits and pain decreased >/= 75% and full weight on left leg    Time  6    Period  Weeks    Status  New    Target Date  04/12/18      PT LONG TERM GOAL #4   Title  left hip strength 4+/5 so patient is able to go up and down stairs with step over step pattern    Time  6    Period  Weeks    Status  New    Target Date  04/12/18      PT LONG TERM GOAL #5   Title  ability to squat and lift within her restrictions from the doctor with correct  body mechanics and due to increased hip strength >/= 4+/5    Time  6    Period  Weeks    Status  New    Target Date  04/12/18            Plan - 03/06/18 1315    Clinical Impression Statement  Patient had many trigger points in the lumbar and gluteal. Patient sits with her left knee extended due to nerve pain. Patient has trigger points in the pelvic floor. Patient is learning low level exercises to contract the core muscles while keeping the left SI joint stabilized. Patient pain level decreased to 3/10 after therapy. Patient has not met goals due to just starting therapy. Patient will benefit from skilled therapy to improve movement, strength and reduce pain to return  to prior function and full duty at work.     Rehab Potential  Excellent    Clinical Impairments Affecting Rehab Potential  Left SI joint fusion 01/18/2018 with 10# lifting limit    PT Frequency  2x / week   1 time initial eval   PT Duration  6 weeks    PT Treatment/Interventions  Cryotherapy;Electrical Stimulation;Iontophoresis 74m/ml Dexamethasone;Ultrasound;Manual techniques;Gait training;Therapeutic activities;Therapeutic exercise;Neuromuscular re-education;Balance training;Patient/family education;Passive range of motion;Dry needling;Scar mobilization;Taping    PT Next Visit Plan  ionto #3, dry needling to left hamstring,; weight shifting task, core stabilization, hip abduction A/AROM; stretches    PT Home Exercise Plan  Access Code: [email or web address]     Consulted and Agree with Plan of Care  Patient       Patient will benefit from skilled therapeutic intervention in order to improve the following deficits and impairments:  Abnormal gait, Increased fascial restricitons, Pain, Decreased coordination, Decreased mobility, Increased muscle spasms, Postural dysfunction, Decreased activity tolerance, Decreased endurance, Decreased range of motion, Decreased strength, Difficulty walking  Visit Diagnosis: Muscle weakness (generalized)  Cramp and spasm  Acute left-sided low back pain with left-sided sciatica     Problem List There are no active problems to display for this patient.   CEarlie Counts PT 03/06/18 1:19 PM   [facility] [address] [place] [address]: [phone]  Fax:  [phone] Name: HASHNA DOROUGHMRN: [id number] of Birth: 808-Mar-1987

## 2018-03-09 ENCOUNTER — Ambulatory Visit: Payer: PRIVATE HEALTH INSURANCE | Admitting: Physical Therapy

## 2018-03-09 ENCOUNTER — Ambulatory Visit: Payer: Self-pay | Admitting: Physical Therapy

## 2018-03-14 ENCOUNTER — Ambulatory Visit: Payer: PRIVATE HEALTH INSURANCE | Admitting: Physical Therapy

## 2018-03-14 ENCOUNTER — Other Ambulatory Visit: Payer: Self-pay

## 2018-03-14 ENCOUNTER — Encounter: Payer: Self-pay | Admitting: Physical Therapy

## 2018-03-14 DIAGNOSIS — M6281 Muscle weakness (generalized): Secondary | ICD-10-CM

## 2018-03-14 DIAGNOSIS — R252 Cramp and spasm: Secondary | ICD-10-CM

## 2018-03-14 DIAGNOSIS — M5442 Lumbago with sciatica, left side: Secondary | ICD-10-CM

## 2018-03-14 NOTE — Therapy (Signed)
[facility] [address] [place], [place], [address] Phone: [phone]   Fax:  [phone]  Physical Therapy Treatment  Patient Details  Name: Amy Ingram MRN: [id number] Date of Birth: 09/14/1985 Referring Provider (PT): Dr. Estill Bamberg   Encounter Date: 03/14/2018  PT End of Session - 03/14/18 1525    Visit Number  3    Date for PT Re-Evaluation  04/12/18    Authorization Type  Workers comp-1 eval and 8 treatments    Authorization - Visit Number  3    Authorization - Number of Visits  9    PT Start Time  1445    PT Stop Time  1528    PT Time Calculation (min)  43 min    Activity Tolerance  Patient tolerated treatment well;No increased pain    Behavior During Therapy  WFL for tasks assessed/performed       Past Medical History:  Diagnosis Date  . Allergy     Past Surgical History:  Procedure Laterality Date  . DENTAL SURGERY  2006   dental implants 2006  . SACROILIAC JOINT FUSION Left 01/18/2018   Procedure: LEFT SACROILIAC JOINT FUSION;  Surgeon: Estill Bamberg, MD;  [facility];  Service: Orthopedics;  Laterality: Left;    There were no vitals filed for this visit.  Subjective Assessment - 03/14/18 1449    Subjective  I missed last visit due to being sick. The nerve pain in left leg is still there.     Patient Stated Goals  strengthening, improve ROM, get back to normal    Currently in Pain?  Yes    Pain Score  5     Pain Location  Sacrum   left thigh   Pain Orientation  Left    Pain Descriptors / Indicators  Aching;Sharp    Pain Type  Chronic pain    Pain Radiating Towards  to left knee    Pain Onset  More than a month ago    Pain Frequency  Constant    Aggravating Factors   walking, sitting a certain way, rolling in bed, getting out of a car, bending over    Pain Relieving Factors  valium at night, ice heat    Multiple Pain Sites  No         [facility] PT Assessment - 03/14/18 0001       Assessment   Medical Diagnosis  s/p left SI joint fusion    Referring Provider (PT)  Dr. Estill Bamberg    Onset Date/Surgical Date  01/18/18    Next MD Visit  03/27/2018    Prior Therapy  prior to surgery      Precautions   Precautions  Other (comment)    Precaution Comments  no lifting over 10#, work is desk duty                   [facility] Adult PT Treatment/Exercise - 03/14/18 0001      Lumbar Exercises: Aerobic   UBE (Upper Arm Bike)  standing, level 1, 2 min forward and 2 min backward   every 15 seconds changing speed; working on core stregnth     Lumbar Exercises: Standing   Other Standing Lumbar Exercises  stand on left leg with right hip on mat with TKE on left 15x      Lumbar Exercises: Quadruped   Other Quadruped Lumbar Exercises  elbows on bolster rocking back and forth with abdominal  bracing and hip hinging, unweight the leg, lift alternate arm    Other Quadruped Lumbar Exercises  tall kneel with small range of hip flexion and extension with core bracing      Manual Therapy   Manual Therapy  Soft tissue mobilization    Soft tissue mobilization  left hamstring, left gluteal, scar on left ilium       Trigger Point Dry Needling - 03/14/18 0001    Consent Given?  Yes    Muscles Treated Lower Quadrant  Hamstring    Hamstring Response  Twitch response elicited;Palpable increased muscle length             PT Short Term Goals - 03/14/18 1531      PT SHORT TERM GOAL #1   Title  independent with initial HEP    Time  3    Period  Weeks    Status  On-going      PT SHORT TERM GOAL #2   Title  ability to ambulate with heel toe gait on the left with no tingling in left leg    Baseline  can do heel toe but not sure about tingling    Time  3    Period  Weeks    Status  On-going      PT SHORT TERM GOAL #3   Title  ability to perform transitional movements with pain level decreased >/= 25% due to engaging the core and pelvic floor    Time  3     Period  Weeks    Status  On-going        PT Long Term Goals - 03/01/18 4158      PT LONG TERM GOAL #1   Title  independent with initial HEP    Time  6    Period  Weeks    Status  New    Target Date  04/12/18      PT LONG TERM GOAL #2   Title  ability to sit with equal weight on bilateral buttocks due to pain decreased >/= 75%    Time  6    Period  Weeks    Status  New    Target Date  04/12/18      PT LONG TERM GOAL #3   Title  able to walk in the community wiht minimal to no gait deficits and pain decreased >/= 75% and full weight on left leg    Time  6    Period  Weeks    Status  New    Target Date  04/12/18      PT LONG TERM GOAL #4   Title  left hip strength 4+/5 so patient is able to go up and down stairs with step over step pattern    Time  6    Period  Weeks    Status  New    Target Date  04/12/18      PT LONG TERM GOAL #5   Title  ability to squat and lift within her restrictions from the doctor with correct body mechanics and due to increased hip strength >/= 4+/5    Time  6    Period  Weeks    Status  New    Target Date  04/12/18            Plan - 03/14/18 1451    Clinical Impression Statement  Patient is able to ambulate without a limp on the left if she is  focusing on it. Patient has to been on her elbows with quadruped exercises due to carpal tunnel. Patient needs tactile cues to contract the abdominals and keep a slight lordosis with quadruped and tall kneel exercises. Patient continues to have soreness in the left gluteals and hamstring. Patient will benefit from skilled therapy to improve movement, strength and reduce pain to return to prior function and full duty at work.     Rehab Potential  Excellent    PT Frequency  2x / week   1 time intial eval   PT Duration  6 weeks    PT Treatment/Interventions  Cryotherapy;Electrical Stimulation;Iontophoresis /ml Dexamethasone;Ultrasound;Manual techniques;Gait training;Therapeutic  activities;Therapeutic exercise;Neuromuscular re-education;Balance training;Patient/family education;Passive range of motion;Dry needling;Scar mobilization;Taping    PT Next Visit Plan  ionto #3 if needed; weight shifting task, core stabilization, hip abduction A/AROM; stretches    PT Home Exercise Plan  Access Code: [email or web address]     Consulted and Agree with Plan of Care  Patient       Patient will benefit from skilled therapeutic intervention in order to improve the following deficits and impairments:  Abnormal gait, Increased fascial restricitons, Pain, Decreased coordination, Decreased mobility, Increased muscle spasms, Postural dysfunction, Decreased activity tolerance, Decreased endurance, Decreased range of motion, Decreased strength, Difficulty walking  Visit Diagnosis: Muscle weakness (generalized)  Cramp and spasm  Acute left-sided low back pain with left-sided sciatica     Problem List There are no active problems to display for this patient.   Eulis Foster, PT 03/14/18 3:32 PM   [facility] [address], [address] [place], [place], [address] Phone: [phone]   Fax:  [phone]  Name: Amy Ingram MRN: [id number] Date of Birth: 06/25/1985

## 2018-03-20 ENCOUNTER — Ambulatory Visit: Payer: PRIVATE HEALTH INSURANCE | Admitting: Physical Therapy

## 2018-03-23 ENCOUNTER — Ambulatory Visit: Payer: PRIVATE HEALTH INSURANCE | Admitting: Physical Therapy

## 2018-03-27 ENCOUNTER — Encounter: Payer: Self-pay | Admitting: Physical Therapy

## 2018-04-03 ENCOUNTER — Encounter: Payer: Self-pay | Admitting: Physical Therapy

## 2018-04-06 ENCOUNTER — Encounter: Payer: Self-pay | Admitting: Physical Therapy

## 2018-04-13 ENCOUNTER — Ambulatory Visit: Payer: PRIVATE HEALTH INSURANCE | Attending: Orthopedic Surgery

## 2018-04-27 ENCOUNTER — Ambulatory Visit: Payer: PRIVATE HEALTH INSURANCE

## 2018-05-01 ENCOUNTER — Other Ambulatory Visit (HOSPITAL_COMMUNITY): Payer: Self-pay | Admitting: Orthopedic Surgery

## 2018-05-01 ENCOUNTER — Other Ambulatory Visit: Payer: Self-pay | Admitting: Orthopedic Surgery

## 2018-05-01 DIAGNOSIS — M5416 Radiculopathy, lumbar region: Secondary | ICD-10-CM

## 2018-05-03 ENCOUNTER — Encounter: Payer: Self-pay | Admitting: Physical Medicine & Rehabilitation

## 2018-05-03 ENCOUNTER — Ambulatory Visit (HOSPITAL_COMMUNITY)
Admission: RE | Admit: 2018-05-03 | Discharge: 2018-05-03 | Disposition: A | Discharge status: Home / Self Care | Payer: PRIVATE HEALTH INSURANCE | Source: Ambulatory Visit | Attending: Orthopedic Surgery | Admitting: Orthopedic Surgery

## 2018-05-03 ENCOUNTER — Other Ambulatory Visit: Payer: Self-pay

## 2018-05-03 DIAGNOSIS — M5416 Radiculopathy, lumbar region: Secondary | ICD-10-CM | POA: Insufficient documentation

## 2018-05-08 ENCOUNTER — Telehealth: Payer: Self-pay | Admitting: Physical Therapy

## 2018-05-15 ENCOUNTER — Encounter: Payer: Self-pay | Admitting: Physical Therapy

## 2018-05-15 ENCOUNTER — Other Ambulatory Visit: Payer: Self-pay

## 2018-05-15 ENCOUNTER — Ambulatory Visit: Payer: PRIVATE HEALTH INSURANCE | Attending: Orthopedic Surgery | Admitting: Physical Therapy

## 2018-05-15 DIAGNOSIS — R252 Cramp and spasm: Secondary | ICD-10-CM | POA: Diagnosis present

## 2018-05-15 DIAGNOSIS — M5442 Lumbago with sciatica, left side: Secondary | ICD-10-CM | POA: Insufficient documentation

## 2018-05-15 DIAGNOSIS — M6281 Muscle weakness (generalized): Secondary | ICD-10-CM | POA: Diagnosis present

## 2018-05-15 NOTE — Patient Instructions (Signed)
Access Code: [email or web address]  URL: [email or web address]  Date: 05/15/2018  Prepared by: Eulis Foster   Exercises  Prone Gluteal Sets - 10 reps - 1 sets - 5 sec hold - 1x daily - 7x weekly  Hooklying Transversus Abdominis Palpation - 10 reps - 1 sets - 5 sec hold - 2x daily - 7x weekly  Supine Hip Adduction Isometric with Ball - 10 reps - 1 sets - 5 sec hold - 2x daily - 7x weekly  Hooklying Single Knee to Chest - 2 reps - 1 sets - 15 sec hold - 2x daily - 7x weekly  Bent Knee Fallouts - 10 reps - 1 sets - 2x daily - 7x weekly  [facility] [address] [place], [place] [address] Phone # [phone] Fax [phone]

## 2018-05-15 NOTE — Therapy (Signed)
[facility] [address], [address] [place], [place], [address] Phone: [phone]   Fax:  [phone]  Physical Therapy Treatment  Patient Details  Name: Amy Ingram MRN: [id number] Date of Birth: 1985/10/13 Referring Provider (PT): Dr. Estill Bamberg   Encounter Date: 05/15/2018  PT End of Session - 05/15/18 1333    Visit Number  4    Date for PT Re-Evaluation  07/10/18    Authorization Type  Workers comp-1 eval and 8 treatments    Authorization - Visit Number  4    Authorization - Number of Visits  9    PT Start Time  1245    PT Stop Time  1330    PT Time Calculation (min)  45 min    Activity Tolerance  Patient tolerated treatment well;No increased pain    Behavior During Therapy  WFL for tasks assessed/performed       Past Medical History:  Diagnosis Date  . Allergy     Past Surgical History:  Procedure Laterality Date  . DENTAL SURGERY  2006   dental implants 2006  . SACROILIAC JOINT FUSION Left 01/18/2018   Procedure: LEFT SACROILIAC JOINT FUSION;  Surgeon: Estill Bamberg, MD;  Location: [facility] OR;  Service: Orthopedics;  Laterality: Left;    There were no vitals filed for this visit.  Subjective Assessment - 05/15/18 1243    Subjective  My left leg is giving me a fit. I had a MRI and waiting for the EMG that is on 05/31/2018.     Patient Stated Goals  strengthening, improve ROM, get back to normal    Currently in Pain?  Yes    Pain Score  4     Pain Location  Sacrum    Pain Orientation  Left    Pain Descriptors / Indicators  Sore    Pain Type  Chronic pain    Pain Onset  More than a month ago    Pain Frequency  Constant    Aggravating Factors   standing and bending over    Pain Relieving Factors  ice or heat    Multiple Pain Sites  Yes    Pain Score  6    Pain Location  Leg    Pain Orientation  Left    Pain Descriptors / Indicators  Sharp    Pain Type  Chronic pain    Pain Radiating Towards  radiates  down back of left leg    Pain Onset  More than a month ago    Pain Frequency  Constant    Aggravating Factors   sit with left knee bent; standing    Pain Relieving Factors  sit with left knee extended         [facility] PT Assessment - 05/15/18 0001      Assessment   Medical Diagnosis  s/p left SI joint fusion    Referring Provider (PT)  Dr. 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    Leisure  gym, riding horses      Cognition   Overall Cognitive Status  Within Functional Limits for tasks assessed      Observation/Other Assessments   Focus on Therapeutic Outcomes (FOTO)   patient limited by 60% as per therapist discretion due to difficulty walking, sitting, daily activities      Posture/Postural Control   Posture/Postural Control  Postural limitations      PROM   Left Hip Flexion  100    Left Hip External Rotation   50    Left Hip Internal Rotation   10    Left Hip ABduction  10      Strength   Left Hip Flexion  3/5   painful   Left Hip Extension  3+/5    Left Hip ABduction  4/5    Left Hip ADduction  4/5    Left Knee Flexion  4+/5    Left Knee Extension  4+/5    Left Ankle Dorsiflexion  3+/5    Left Ankle Plantar Flexion  4/5    Left Ankle Inversion  3/5    Left Ankle Eversion  3/5                   [facility] Adult PT Treatment/Exercise - 05/15/18 0001      Lumbar Exercises: Supine   Clam  20 reps;1 second   left leg only     Knee/Hip Exercises: Sidelying   Other Sidelying Knee/Hip Exercises  AAROM to left hip for flexion and extension    Other  Sidelying Knee/Hip Exercises  right sidely and press left heel into the therapist to increase contraction of the left gluteal      Manual Therapy   Manual Therapy  Soft tissue mobilization;Joint mobilization;Passive ROM    Joint Mobilization  P-A and rotational mobilization to T11-L2; left rib cage mobilization; grad 3 mobilization to left hip for medial glide and inferior glide    Soft tissue mobilization  left quadratus, left pirirformis, left hamstring, left levator ani and around the left ishcial tuberosity    Passive ROM  left hip flexion and extension        Trigger Point Dry Needling - 05/15/18 0001    Consent Given?  Yes    Lumbar multifidi Response  Twitch response elicited;Palpable increased muscle length   T11-L2   Quadratus Lumborum Response  Twitch response elicited;Palpable increased muscle length           PT Education - 05/15/18 1329    Education Details  Access Code: [email or web address]    Person(s) Educated  Patient    Methods  Explanation;Demonstration;Verbal cues;Handout    Comprehension  Returned demonstration;Verbalized understanding       PT Short Term Goals - 05/15/18 1246      PT SHORT TERM GOAL #1   Title  independent with initial HEP    Time  3    Period  Weeks    Status  On-going      PT SHORT TERM GOAL #2   Title  ability to ambulate with heel toe gait on the left with no tingling in left leg    Baseline  able to get left heel down with 25% less tingling    Time  3    Period  Weeks    Status  On-going    Target Date  03/22/18  PT SHORT TERM GOAL #3   Title  ability to perform transitional movements with pain level decreased >/= 25% due to engaging the core and pelvic floor    Baseline  no change    Time  3    Period  Weeks    Status  On-going        PT Long Term Goals - 03/01/18 1308      PT LONG TERM GOAL #1   Title  independent with initial HEP    Time  6    Period  Weeks    Status  New    Target Date  04/12/18      PT LONG TERM  GOAL #2   Title  ability to sit with equal weight on bilateral buttocks due to pain decreased >/= 75%    Time  6    Period  Weeks    Status  New    Target Date  04/12/18      PT LONG TERM GOAL #3   Title  able to walk in the community wiht minimal to no gait deficits and pain decreased >/= 75% and full weight on left leg    Time  6    Period  Weeks    Status  New    Target Date  04/12/18      PT LONG TERM GOAL #4   Title  left hip strength 4+/5 so patient is able to go up and down stairs with step over step pattern    Time  6    Period  Weeks    Status  New    Target Date  04/12/18      PT LONG TERM GOAL #5   Title  ability to squat and lift within her restrictions from the doctor with correct body mechanics and due to increased hip strength >/= 4+/5    Time  6    Period  Weeks    Status  New    Target Date  04/12/18            Plan - 05/15/18 1320    Clinical Impression Statement  Patient has not attended therapy from 03/14/2018 to 05/15/2018 due to the center closing down for a period of time and limiting hours after due to Coronavirus. Patient had a MRI and is waiting for EMG. Patient has weakness in the left ankle, knee and hip. Patient is now able to walk with her heel down and 25% less tingling in the left leg. Patient had trigger points in the left lumbar and quadratus that will refer pain into the left hip and back of left thigh. Patient Left hip flexion P/ROM was 100% but after manual work is 115 degrees. Patient has limitation of left left hip abduction and rotation P/ROM. Patient will benefit from skilled therapy to improve muscle coordination, reduce trigger points, and improve movement with less pain.     Rehab Potential  Excellent    Clinical Impairments Affecting Rehab Potential  Left SI joint fusion 01/18/2018 with 10# lifting limit    PT Frequency  2x / week   1-2 times per week   PT Duration  8 weeks    PT Treatment/Interventions  Cryotherapy;Electrical  Stimulation;Iontophoresis /ml Dexamethasone;Ultrasound;Manual techniques;Gait training;Therapeutic activities;Therapeutic exercise;Neuromuscular re-education;Balance training;Patient/family education;Passive range of motion;Dry needling;Scar mobilization;Taping    PT Next Visit Plan  see if needs dry needling; standing TKE on left; hip flexion isometrics, standing hip abduction with towel, soft tissue work to  left hip, pelvic floor    PT Home Exercise Plan  Access Code: [email or web address]     Recommended Other Services  MD signed initial eval. renewal note sent on 05/15/2018    Consulted and Agree with Plan of Care  Patient       Patient will benefit from skilled therapeutic intervention in order to improve the following deficits and impairments:  Abnormal gait, Increased fascial restricitons, Pain, Decreased coordination, Decreased mobility, Increased muscle spasms, Postural dysfunction, Decreased activity tolerance, Decreased endurance, Decreased range of motion, Decreased strength, Difficulty walking  Visit Diagnosis: Muscle weakness (generalized) - Plan: PT plan of care cert/re-cert  Cramp and spasm - Plan: PT plan of care cert/re-cert  Acute left-sided low back pain with left-sided sciatica - Plan: PT plan of care cert/re-cert     Problem List There are no active problems to display for this patient.   Eulis Foster, PT 05/15/18 1:42 PM ' [facility] [address], [address] [place], [place], [address] Phone: [phone]   Fax:  [phone]  Name: Amy Ingram MRN: [id number] Date of Birth: 1985/01/05

## 2018-05-21 ENCOUNTER — Ambulatory Visit: Payer: PRIVATE HEALTH INSURANCE

## 2018-05-29 ENCOUNTER — Other Ambulatory Visit: Payer: Self-pay

## 2018-05-29 ENCOUNTER — Ambulatory Visit: Payer: PRIVATE HEALTH INSURANCE | Attending: Orthopedic Surgery | Admitting: Physical Therapy

## 2018-05-29 ENCOUNTER — Ambulatory Visit: Payer: PRIVATE HEALTH INSURANCE | Admitting: Physical Therapy

## 2018-05-29 ENCOUNTER — Encounter: Payer: Self-pay | Admitting: Physical Therapy

## 2018-05-29 DIAGNOSIS — R252 Cramp and spasm: Secondary | ICD-10-CM | POA: Diagnosis present

## 2018-05-29 DIAGNOSIS — M5442 Lumbago with sciatica, left side: Secondary | ICD-10-CM

## 2018-05-29 DIAGNOSIS — M6281 Muscle weakness (generalized): Secondary | ICD-10-CM

## 2018-05-29 NOTE — Patient Instructions (Signed)
Access Code: [email or web address]  URL: [email or web address]  Date: 05/29/2018  Prepared by: Eulis Foster   Exercises Prone Gluteal Sets - 10 reps - 1 sets - 5 sec hold - 1x daily - 7x weekly Hooklying Transversus Abdominis Palpation - 10 reps - 1 sets - 5 sec hold - 2x daily - 7x weekly Supine Hip Adduction Isometric with Ball - 10 reps - 1 sets - 5 sec hold - 2x daily - 7x weekly Hooklying Single Knee to Chest - 2 reps - 1 sets - 15 sec hold - 2x daily - 7x weekly Bent Knee Fallouts - 10 reps - 1 sets - 2x daily - 7x weekly Standing Terminal Knee Extension with Resistance - 15 reps - 1 sets - 1x daily - 7x weekly Standing Hip Abduction on Slider - 15 reps - 1 sets - 1x daily - 7x weekly Alternating Heel Raises - 15 reps - 1 sets - 1x daily - 7x weekly [facility] [address], [address] [place], [place] [address] Phone # [phone] Fax [phone]

## 2018-05-29 NOTE — Therapy (Signed)
[facility] [address], [address] [place], [place], [address] Phone: [phone]   Fax:  [phone]  Physical Therapy Treatment  Patient Details  Name: Amy Ingram MRN: [id number] Date of Birth: [birth year] Referring Provider (PT): Dr. Estill BambergMark Dumonski   Encounter Date: 05/29/2018  PT End of Session - 05/29/18 1354    Visit Number  5    Date for PT Re-Evaluation  07/10/18    Authorization Type  Workers comp-1 eval and 8 treatments    Authorization - Visit Number  5    Authorization - Number of Visits  9    PT Start Time  1345    PT Stop Time  1430    PT Time Calculation (min)  45 min    Activity Tolerance  Patient tolerated treatment well;No increased pain    Behavior During Therapy  WFL for tasks assessed/performed       Past Medical History:  Diagnosis Date  . Allergy     Past Surgical History:  Procedure Laterality Date  . DENTAL SURGERY  2006   dental implants 2006  . SACROILIAC JOINT FUSION Left 01/18/2018   Procedure: LEFT SACROILIAC JOINT FUSION;  Surgeon: Estill Bambergumonski, Mark, MD;  Location: [facility] OR;  Service: Orthopedics;  Laterality: Left;    There were no vitals filed for this visit.  Subjective Assessment - 05/29/18 1349    Subjective  I have the EMG on Thursday. I see the MD o n6/01/2018. the rain and cold increases the pain. I was sore after last visit for 2 days. I can move my hip a little better. When walk the tingling in my left leg is 80% better and is not constant.     Patient Stated Goals  strengthening, improve ROM, get back to normal    Currently in Pain?  Yes    Pain Score  6     Pain Location  Sacrum    Pain Orientation  Left    Pain Descriptors / Indicators  Sore    Pain Type  Chronic pain    Pain Onset  More than a month ago    Pain Frequency  Constant    Aggravating Factors   standing and bending over    Pain Relieving Factors  ice or heat    Multiple Pain Sites  Yes    Pain Score  7     Pain Location  Leg    Pain Orientation  Left    Pain Descriptors / Indicators  Sharp    Pain Type  Chronic pain    Pain Radiating Towards  radiates down back of left leg    Pain Onset  More than a month ago    Pain Frequency  Intermittent    Aggravating Factors   sit with left knee bent; standing     Pain Relieving Factors  sit with left knee extended         [facility] PT Assessment - 05/29/18 0001      PROM   Left Hip Flexion  120                   [facility] Adult PT Treatment/Exercise - 05/29/18 0001      Lumbar Exercises: Standing   Other Standing Lumbar Exercises  sit to stand 10 times slow with control; standing hip abduction with foot on towel 10 times each side    Other Standing Lumbar Exercises  TKE with green band with VC on  control      Modalities   Modalities  Iontophoresis      Iontophoresis   Type of Iontophoresis  Dexamethasone    Location  left piriformis by sacrum    Dose  1 ml, #3    Time  6 hour      Manual Therapy   Manual Therapy  Soft tissue mobilization;Neural Stretch    Manual therapy comments  used assistive device to massage the left piriformis    Soft tissue mobilization  left piriformis, left gluteal    Neural Stretch  left leg in sidly       Trigger Point Dry Needling - 05/29/18 0001    Consent Given?  Yes    Gluteus Maximus Response  Twitch response elicited;Palpable increased muscle length    Piriformis Response  Twitch response elicited;Palpable increased muscle length           PT Education - 05/29/18 1437    Education Details  Access Code: [email or web address]    Person(s) Educated  Patient    Methods  Explanation;Demonstration;Verbal cues;Handout    Comprehension  Returned demonstration;Verbalized understanding       PT Short Term Goals - 05/29/18 1354      PT SHORT TERM GOAL #1   Title  independent with initial HEP    Time  3    Period  Weeks    Status  Achieved      PT SHORT TERM GOAL #2   Title  ability to ambulate  with heel toe gait on the left with no tingling in left leg    Baseline  able to get left heel down with 80% less tingling and not constant    Time  3    Period  Weeks    Status  On-going      PT SHORT TERM GOAL #3   Title  ability to perform transitional movements with pain level decreased >/= 25% due to engaging the core and pelvic floor    Baseline  10% change    Time  3    Period  Weeks    Status  On-going        PT Long Term Goals - 03/01/18 1829      PT LONG TERM GOAL #1   Title  independent with initial HEP    Time  6    Period  Weeks    Status  New    Target Date  04/12/18      PT LONG TERM GOAL #2   Title  ability to sit with equal weight on bilateral buttocks due to pain decreased >/= 75%    Time  6    Period  Weeks    Status  New    Target Date  04/12/18      PT LONG TERM GOAL #3   Title  able to walk in the community wiht minimal to no gait deficits and pain decreased >/= 75% and full weight on left leg    Time  6    Period  Weeks    Status  New    Target Date  04/12/18      PT LONG TERM GOAL #4   Title  left hip strength 4+/5 so patient is able to go up and down stairs with step over step pattern    Time  6    Period  Weeks    Status  New    Target Date  04/12/18  PT LONG TERM GOAL #5   Title  ability to squat and lift within her restrictions from the doctor with correct body mechanics and due to increased hip strength >/= 4+/5    Time  6    Period  Weeks    Status  New    Target Date  04/12/18            Plan - 05/29/18 1426    Clinical Impression Statement  Patient has trigger point in left piriformis and glueteal muscle. Patient is seeing the doctor on 6/1 and having EMG this week. Patient left hip flexion P/ROM increased to 120 degrees. Patient is starting to work on her strength of left hip and core. Patient left lower extremity tingling is intermittent now. Patient is able to walk heel toe now. Patient will benefit from home TENS  unit to work with pain management. Patient will benefit from skilled therapy to improve muscle coordination, reduce trigger points, and improve movement with less pain.     Rehab Potential  Excellent    Clinical Impairments Affecting Rehab Potential  Left SI joint fusion 01/18/2018 with 10# lifting limit    PT Frequency  2x / week    PT Duration  8 weeks    PT Treatment/Interventions  Cryotherapy;Electrical Stimulation;Iontophoresis 4mg /ml Dexamethasone;Ultrasound;Manual techniques;Gait training;Therapeutic activities;Therapeutic exercise;Neuromuscular re-education;Balance training;Patient/family education;Passive range of motion;Dry needling;Scar mobilization;Taping    PT Next Visit Plan  see if ionto helped; see what MD says, would like to add home TENS unit to plan; continue with closed chain strength; soft tissue work and neural tension stretch    PT Home Exercise Plan  Access Code: [email or web address]     Consulted and Agree with Plan of Care  Patient       Patient will benefit from skilled therapeutic intervention in order to improve the following deficits and impairments:  Abnormal gait, Increased fascial restricitons, Pain, Decreased coordination, Decreased mobility, Increased muscle spasms, Postural dysfunction, Decreased activity tolerance, Decreased endurance, Decreased range of motion, Decreased strength, Difficulty walking  Visit Diagnosis: Muscle weakness (generalized) - Plan: PT plan of care cert/re-cert  Cramp and spasm - Plan: PT plan of care cert/re-cert  Acute left-sided low back pain with left-sided sciatica - Plan: PT plan of care cert/re-cert     Problem List There are no active problems to display for this patient.   Eulis Foster, PT 05/29/18 2:45 PM   [facility] [address] [place], [place], [address] Phone: [phone]   Fax:  [phone]  Name: Amy Ingram MRN: [id number] Date of Birth:  08/16/1985

## 2018-05-31 ENCOUNTER — Encounter: Payer: Self-pay | Admitting: Physical Medicine & Rehabilitation

## 2018-05-31 ENCOUNTER — Encounter
Payer: PRIVATE HEALTH INSURANCE | Attending: Physical Medicine & Rehabilitation | Admitting: Physical Medicine & Rehabilitation

## 2018-05-31 ENCOUNTER — Other Ambulatory Visit: Payer: Self-pay

## 2018-05-31 VITALS — BP 126/84 | HR 77 | Temp 98.3°F | Ht 65.0 in | Wt 112.0 lb

## 2018-05-31 DIAGNOSIS — M79605 Pain in left leg: Secondary | ICD-10-CM | POA: Diagnosis present

## 2018-06-06 ENCOUNTER — Other Ambulatory Visit: Payer: Self-pay

## 2018-06-06 ENCOUNTER — Encounter: Payer: Self-pay | Admitting: Physical Therapy

## 2018-06-06 ENCOUNTER — Ambulatory Visit: Payer: PRIVATE HEALTH INSURANCE | Attending: Orthopedic Surgery | Admitting: Physical Therapy

## 2018-06-06 DIAGNOSIS — M5442 Lumbago with sciatica, left side: Secondary | ICD-10-CM | POA: Diagnosis present

## 2018-06-06 DIAGNOSIS — M6281 Muscle weakness (generalized): Secondary | ICD-10-CM | POA: Insufficient documentation

## 2018-06-06 DIAGNOSIS — R252 Cramp and spasm: Secondary | ICD-10-CM | POA: Diagnosis present

## 2018-06-06 NOTE — Therapy (Signed)
[facility] [address] [place], [place], [address] Phone: [phone]   Fax:  [phone]  Physical Therapy Treatment  Patient Details  Name: Amy Ingram MRN: [id number] Date of Birth: 06-Apr-1985 Referring Provider (PT): Dr. Estill Bamberg   Encounter Date: 06/06/2018  PT End of Session - 06/06/18 1331    Visit Number  6    Date for PT Re-Evaluation  07/10/18    Authorization Type  Workers comp-1 eval and 8 treatments    Authorization - Visit Number  6    Authorization - Number of Visits  9    PT Start Time  1300    PT Stop Time  1345    PT Time Calculation (min)  45 min    Activity Tolerance  Patient tolerated treatment well;No increased pain    Behavior During Therapy  WFL for tasks assessed/performed       Past Medical History:  Diagnosis Date  . Allergy     Past Surgical History:  Procedure Laterality Date  . DENTAL SURGERY  2006   dental implants 2006  . SACROILIAC JOINT FUSION Left 01/18/2018   Procedure: LEFT SACROILIAC JOINT FUSION;  Surgeon: Estill Bamberg, MD;  Location: [facility];  Service: Orthopedics;  Laterality: Left;    There were no vitals filed for this visit.  Subjective Assessment - 06/06/18 1305    Subjective  The ionto patch helped. MD wants to do an injection on the hamstring. I see Dr. Yevette Edwards on 07/02/2018. I felt sore after last visit. MD wants [place] to work on the right thigh. My TENS unit was ordered. Tingling and numbness in left heel is 50% better.     Patient is accompained by:  Interpreter    Patient Stated Goals  strengthening, improve ROM, get back to normal    Currently in Pain?  Yes    Pain Score  5     Pain Location  Leg    Pain Orientation  Left    Pain Descriptors / Indicators  Sore;Tightness    Pain Type  Chronic pain    Pain Onset  More than a month ago    Pain Frequency  Constant    Aggravating Factors   standing and bending over    Pain Relieving Factors  ice  or heat    Multiple Pain Sites  No                       [facility] Adult PT Treatment/Exercise - 06/06/18 0001      Lumbar Exercises: Aerobic   Nustep  level 1 4 min; seat #5, no arms      Lumbar Exercises: Machines for Strengthening   Leg Press  both legs 50# 2 x 10 working on control; SEat #7      Knee/Hip Exercises: Standing   Heel Raises  Left;15 reps;1 set   left on floor and right on 2 inch step   Forward Lunges  Left;1 set;10 reps    SLS  Stand on left leg with slide on right 10times all directions then half circles; stand on right leg with left leg on slider moving forward backward quickly then side to side      Manual Therapy   Manual Therapy  Soft tissue mobilization    Soft tissue mobilization  left gluteals, left piriformis, left hamstring       Trigger Point Dry Needling - 06/06/18 0001  Consent Given?  Yes    Muscles Treated Lower Quadrant  Hamstring    Muscles Treated Back/Hip  Gluteus minimus;Gluteus medius;Gluteus maximus;Piriformis    Hamstring Response  Twitch response elicited;Palpable increased muscle length    Gluteus Minimus Response  Twitch response elicited;Palpable increased muscle length    Gluteus Medius Response  Twitch response elicited;Palpable increased muscle length    Gluteus Maximus Response  Twitch response elicited;Palpable increased muscle length    Piriformis Response  Twitch response elicited;Palpable increased muscle length             PT Short Term Goals - 06/06/18 1357      PT SHORT TERM GOAL #1   Title  independent with initial HEP    Time  3    Period  Weeks    Status  Achieved      PT SHORT TERM GOAL #2   Title  ability to ambulate with heel toe gait on the left with no tingling in left leg    Baseline  able to get left heel down with 80% less tingling and not constant    Time  3    Period  Weeks    Status  On-going      PT SHORT TERM GOAL #3   Title  ability to perform transitional movements with  pain level decreased >/= 25% due to engaging the core and pelvic floor    Baseline  10% change    Time  3    Status  On-going    Target Date  03/22/18        PT Long Term Goals - 03/01/18 0928      PT LONG TERM GOAL #1   Title  independent with initial HEP    Time  6    Period  Weeks    Status  New    Target Date  04/12/18      PT LONG TERM GOAL #2   Title  ability to sit with equal weight on bilateral buttocks due to pain decreased >/= 75%    Time  6    Period  Weeks    Status  New    Target Date  04/12/18      PT LONG TERM GOAL #3   Title  able to walk in the community wiht minimal to no gait deficits and pain decreased >/= 75% and full weight on left leg    Time  6    Period  Weeks    Status  New    Target Date  04/12/18      PT LONG TERM GOAL #4   Title  left hip strength 4+/5 so patient is able to go up and down stairs with step over step pattern    Time  6    Period  Weeks    Status  New    Target Date  04/12/18      PT LONG TERM GOAL #5   Title  ability to squat and lift within her restrictions from the doctor with correct body mechanics and due to increased hip strength >/= 4+/5    Time  6    Period  Weeks    Status  New    Target Date  04/12/18            Plan - 06/06/18 1311    Clinical Impression Statement  Patient numbness and tingling in left heel is better by 50%. Patient is walking with heel toe  gait. Patient was able to work on the leg press without increased pain and needed tactile cues to work on left knee control. Patient has a home TENS unit on order. Patient has soreness in the left posterior thigh. Patient will benefit  from skilled therapy to improve muscle coordination, reduce trigger points, and improve movement with less pain.     Rehab Potential  Excellent    Clinical Impairments Affecting Rehab Potential  Left SI joint fusion 01/18/2018 with 10# lifting limit    PT Duration  8 weeks    PT Treatment/Interventions   Cryotherapy;Electrical Stimulation;Iontophoresis 4mg /ml Dexamethasone;Ultrasound;Manual techniques;Gait training;Therapeutic activities;Therapeutic exercise;Neuromuscular re-education;Balance training;Patient/family education;Passive range of motion;Dry needling;Scar mobilization;Taping    PT Next Visit Plan  continue with closed chain strength for left leg; nustep; squats;  soft tissue work and neural tension stretch    PT Home Exercise Plan  Access Code: [email or web address]     Recommended Other Services  all notes signed by MD    Consulted and Agree with Plan of Care  Patient       Patient will benefit from skilled therapeutic intervention in order to improve the following deficits and impairments:  Abnormal gait, Increased fascial restricitons, Pain, Decreased coordination, Decreased mobility, Increased muscle spasms, Postural dysfunction, Decreased activity tolerance, Decreased endurance, Decreased range of motion, Decreased strength, Difficulty walking  Visit Diagnosis: Muscle weakness (generalized)  Cramp and spasm  Acute left-sided low back pain with left-sided sciatica     Problem List There are no active problems to display for this patient.   Eulis FosterCheryl Maniah Nading, PT 06/06/18 1:58 PM   [facility] [address], [address] [place], [place], [address] Phone: [phone]   Fax:  [phone]  Name: Amy Ingram MRN: [id number] Date of Birth: 05-Dec-1985

## 2018-06-19 ENCOUNTER — Ambulatory Visit: Payer: PRIVATE HEALTH INSURANCE | Admitting: Physical Therapy

## 2018-06-19 ENCOUNTER — Other Ambulatory Visit: Payer: Self-pay

## 2018-06-19 DIAGNOSIS — M6281 Muscle weakness (generalized): Secondary | ICD-10-CM | POA: Diagnosis not present

## 2018-06-19 DIAGNOSIS — R252 Cramp and spasm: Secondary | ICD-10-CM

## 2018-06-19 DIAGNOSIS — M5442 Lumbago with sciatica, left side: Secondary | ICD-10-CM

## 2018-06-19 NOTE — Therapy (Signed)
[facility] [address], [address] [place], [place], [address] Phone: [phone]   Fax:  [phone]  Physical Therapy Treatment  Patient Details  Name: Amy Ingram MRN: [id number] Date of Birth: Apr 16, 1985 Referring Provider (PT): Dr. Estill BambergMark Dumonski   Encounter Date: 06/19/2018  PT End of Session - 06/19/18 1106    Visit Number  7    Date for PT Re-Evaluation  07/10/18    Authorization Type  approved 6 more visits on 06/06/2018    Authorization - Visit Number  7    Authorization - Number of Visits  15    PT Start Time  1100    PT Stop Time  1140    PT Time Calculation (min)  40 min    Activity Tolerance  Patient tolerated treatment well;No increased pain    Behavior During Therapy  WFL for tasks assessed/performed       Past Medical History:  Diagnosis Date  . Allergy     Past Surgical History:  Procedure Laterality Date  . DENTAL SURGERY  2006   dental implants 2006  . SACROILIAC JOINT FUSION Left 01/18/2018   Procedure: LEFT SACROILIAC JOINT FUSION;  Surgeon: Estill Bambergumonski, Mark, MD;  [facility];  Service: Orthopedics;  Laterality: Left;    There were no vitals filed for this visit.  Subjective Assessment - 06/19/18 1107    Subjective  I am getting a steroid shot in the left leg.    Patient Stated Goals  strengthening, improve ROM, get back to normal    Currently in Pain?  Yes    Pain Score  6     Pain Location  Leg    Pain Orientation  Left    Pain Descriptors / Indicators  Sore;Stabbing   soreness in hip, stabbing in left leg   Pain Type  Chronic pain    Pain Radiating Towards  to left knee    Pain Onset  More than a month ago    Pain Frequency  Constant    Aggravating Factors   standing and bending over    Pain Relieving Factors  ice or heat    Multiple Pain Sites  No    Pain Score  6    Pain Location  Leg    Pain Orientation  Left    Pain Descriptors / Indicators  Sharp    Pain Type  Chronic  pain    Pain Onset  More than a month ago    Pain Frequency  Intermittent    Aggravating Factors   sit with left knee bent; standing    Pain Relieving Factors  sit with left extended                       [facility] Adult PT Treatment/Exercise - 06/19/18 0001      Lumbar Exercises: Aerobic   Nustep  level 1 4 min; seat #5, no arms   see left leg with effort     Lumbar Exercises: Machines for Strengthening   Leg Press  both legs 50# 2 x 10 working on control; SEat #7   red band around knees to work on ER     Knee/Hip Exercises: Standing   Heel Raises  Left;15 reps;1 set   left on floor and right on 2 inch step   Lateral Step Up  Left;1 set;15 reps;Hand Hold: 1   on oval foam   Wall Squat  1 set;10  reps   ball between back and wall; arm flexion    Wall Squat Limitations  squat with diagonal from right hip to left shoulder to increase weight on left    SLS  Stand on left leg with slide on right 10times all directions then half circles; stand on right leg with left leg on slider moving forward backward quickly then side to side      Knee/Hip Exercises: Seated   Long Arc Quad  Strengthening;Left;3 sets;10 reps;Weights    Long Arc Quad Weight  2 lbs.    Long [employer] Quad Limitations  ball squeeze and go slow    Hamstring Curl  Strengthening;Left;3 sets;10 reps   red band              PT Short Term Goals - 06/19/18 1149      PT SHORT TERM GOAL #2   Title  ability to ambulate with heel toe gait on the left with no tingling in left leg    Baseline  able to get left heel down with 80% less tingling and not constant; see of steroid shot will help    Time  3    Period  Weeks    Status  On-going      PT SHORT TERM GOAL #3   Title  ability to perform transitional movements with pain level decreased >/= 25% due to engaging the core and pelvic floor    Baseline  10% change; ses if steroid shot will help    Time  3    Period  Weeks    Status  On-going    Target Date   03/22/18        PT Long Term Goals - 03/01/18 0928      PT LONG TERM GOAL #1   Title  independent with initial HEP    Time  6    Period  Weeks    Status  New    Target Date  04/12/18      PT LONG TERM GOAL #2   Title  ability to sit with equal weight on bilateral buttocks due to pain decreased >/= 75%    Time  6    Period  Weeks    Status  New    Target Date  04/12/18      PT LONG TERM GOAL #3   Title  able to walk in the community wiht minimal to no gait deficits and pain decreased >/= 75% and full weight on left leg    Time  6    Period  Weeks    Status  New    Target Date  04/12/18      PT LONG TERM GOAL #4   Title  left hip strength 4+/5 so patient is able to go up and down stairs with step over step pattern    Time  6    Period  Weeks    Status  New    Target Date  04/12/18      PT LONG TERM GOAL #5   Title  ability to squat and lift within her restrictions from the doctor with correct body mechanics and due to increased hip strength >/= 4+/5    Time  6    Period  Weeks    Status  New    Target Date  04/12/18            Plan - 06/19/18 1147    Clinical Impression Statement  Patient  needs tactle cues for control of the hip and left knee. Patient needed verbal cues to engage the core with her exercise. Patient will IR the left hip with standing and leg press. Patient will be having steroid shot in left leg. Patient contines to walk with increased heel strike on the left. Patient will benefit from skilled therapy to improve muscle coordination, reduce trigger points, and improve movement with less pain.    Rehab Potential  Excellent    Clinical Impairments Affecting Rehab Potential  Left SI joint fusion 01/18/2018 with 10# lifting limit    PT Frequency  2x / week    PT Duration  8 weeks    PT Treatment/Interventions  Cryotherapy;Electrical Stimulation;Iontophoresis 4mg /ml Dexamethasone;Ultrasound;Manual techniques;Gait training;Therapeutic  activities;Therapeutic exercise;Neuromuscular re-education;Balance training;Patient/family education;Passive range of motion;Dry needling;Scar mobilization;Taping    PT Next Visit Plan  continue with closed chain strength for left leg; nustep; squats;  soft tissue work and neural tension stretch; see how steroid shot does for left leg    PT Home Exercise Plan  Access Code: [email or web address]     Consulted and Agree with Plan of Care  Patient       Patient will benefit from skilled therapeutic intervention in order to improve the following deficits and impairments:  Abnormal gait, Increased fascial restricitons, Pain, Decreased coordination, Decreased mobility, Increased muscle spasms, Postural dysfunction, Decreased activity tolerance, Decreased endurance, Decreased range of motion, Decreased strength, Difficulty walking  Visit Diagnosis: 1. Muscle weakness (generalized)   2. Cramp and spasm   3. Acute left-sided low back pain with left-sided sciatica        Problem List There are no active problems to display for this patient.   Earlie Counts, PT 06/19/18 11:51 AM   [facility] [address] [place], [place], [address] Phone: [phone]   Fax:  [phone]  Name: Amy Ingram MRN: [id number] Date of Birth: 1985/09/09

## 2018-06-26 ENCOUNTER — Other Ambulatory Visit: Payer: Self-pay

## 2018-06-26 ENCOUNTER — Encounter: Payer: Self-pay | Admitting: Physical Therapy

## 2018-06-26 ENCOUNTER — Ambulatory Visit: Payer: PRIVATE HEALTH INSURANCE | Admitting: Physical Therapy

## 2018-06-26 DIAGNOSIS — M5442 Lumbago with sciatica, left side: Secondary | ICD-10-CM

## 2018-06-26 DIAGNOSIS — M6281 Muscle weakness (generalized): Secondary | ICD-10-CM | POA: Diagnosis not present

## 2018-06-26 DIAGNOSIS — R252 Cramp and spasm: Secondary | ICD-10-CM

## 2018-06-26 NOTE — Therapy (Signed)
[facility] [address] [place], [place], [address] Phone: [phone]   Fax:  [phone]  Physical Therapy Treatment  Patient Details  Name: Amy Ingram MRN: [id number] Date of Birth: Aug 20, 1985 Referring Provider (PT): Dr. Phylliss Bob   Encounter Date: 06/26/2018  PT End of Session - 06/26/18 1147    Visit Number  8    Date for PT Re-Evaluation  07/10/18    Authorization Type  approved 6 more visits on 06/06/2018    Authorization - Visit Number  8    Authorization - Number of Visits  15    Activity Tolerance  Patient tolerated treatment well;No increased pain    Behavior During Therapy  WFL for tasks assessed/performed       Past Medical History:  Diagnosis Date  . Allergy     Past Surgical History:  Procedure Laterality Date  . DENTAL SURGERY  2006   dental implants 2006  . SACROILIAC JOINT FUSION Left 01/18/2018   Procedure: LEFT SACROILIAC JOINT FUSION;  Surgeon: Phylliss Bob, MD;  Location: [place];  Service: Orthopedics;  Laterality: Left;    There were no vitals filed for this visit.  Subjective Assessment - 06/26/18 1106    Subjective  I had the steriod shot into the piriformis muscle and the pain has returned. I feel stronger.    Patient Stated Goals  strengthening, improve ROM, get back to normal    Currently in Pain?  Yes    Pain Score  5     Pain Location  Leg    Pain Orientation  Left    Pain Descriptors / Indicators  Sore;Stabbing    Pain Type  Chronic pain    Pain Onset  More than a month ago    Pain Frequency  Intermittent    Aggravating Factors   standing and bending over    Pain Relieving Factors  ice heat         [facility] PT Assessment - 06/26/18 0001      Strength   Left Hip Flexion  3+/5    Left Hip External Rotation  4/5    Left Hip Internal Rotation  4/5    Left Hip ABduction  4/5    Left Hip ADduction  4/5    Right Knee Flexion  4+/5    Left Knee Flexion  4+/5     Left Knee Extension  4+/5                   [facility] Adult PT Treatment/Exercise - 06/26/18 0001      Lumbar Exercises: Machines for Strengthening   Leg Press  both legs 50# 2 x 10 working on control; SEat #7; leftleg 5# 10x, tried 15 and 10# but increased pain   red band around knees to work on ER     Lumbar Exercises: Standing   Other Standing Lumbar Exercises  stand in lunge position moving red band side to side 10x each way      Lumbar Exercises: Supine   Bridge with clamshell  5 reps;1 second   2 sets   Other Supine Lumbar Exercises  supine hip circles on left 10 each way    Other Supine Lumbar Exercises  SLR on left 15x with core bracing      Knee/Hip Exercises: Standing   Lateral Step Up  Left;1 set;15 reps;Hand Hold: 2;Step Height: 4"    Forward Step Up  Left;1 set;15 reps;Step Height:  4";Hand Hold: 2   no pain              PT Short Term Goals - 06/26/18 1112      PT SHORT TERM GOAL #2   Title  ability to ambulate with heel toe gait on the left with no tingling in left leg    Baseline  able to get left heel down with 95% less tingling and not constant; intermittently    Time  3    Period  Weeks    Status  On-going      PT SHORT TERM GOAL #3   Title  ability to perform transitional movements with pain level decreased >/= 25% due to engaging the core and pelvic floor    Baseline  40% better    Time  3    Period  Weeks    Status  Achieved    Target Date  03/22/18        PT Long Term Goals - 03/01/18 0928      PT LONG TERM GOAL #1   Title  independent with initial HEP    Time  6    Period  Weeks    Status  New    Target Date  04/12/18      PT LONG TERM GOAL #2   Title  ability to sit with equal weight on bilateral buttocks due to pain decreased >/= 75%    Time  6    Period  Weeks    Status  New    Target Date  04/12/18      PT LONG TERM GOAL #3   Title  able to walk in the community wiht minimal to no gait deficits and pain  decreased >/= 75% and full weight on left leg    Time  6    Period  Weeks    Status  New    Target Date  04/12/18      PT LONG TERM GOAL #4   Title  left hip strength 4+/5 so patient is able to go up and down stairs with step over step pattern    Time  6    Period  Weeks    Status  New    Target Date  04/12/18      PT LONG TERM GOAL #5   Title  ability to squat and lift within her restrictions from the doctor with correct body mechanics and due to increased hip strength >/= 4+/5    Time  6    Period  Weeks    Status  New    Target Date  04/12/18            Plan - 06/26/18 1114    Clinical Impression Statement  Patient has increased pelvic and leg movement with stabilization exercises due to weakness. Patient has to be monitored for pain and correct movement. Patient pain with movement is 40% better. Patient can walk with heel strike on left with tingling decreased by 95%. Patient will benefit from skilled therapy to improve muscle coordination,reduce trigger points, and improve movement with less pain.    Rehab Potential  Excellent    Clinical Impairments Affecting Rehab Potential  Left SI joint fusion 01/18/2018 with 10# lifting limit    PT Frequency  2x / week    PT Duration  8 weeks    PT Treatment/Interventions  Cryotherapy;Electrical Stimulation;Iontophoresis 4mg /ml Dexamethasone;Ultrasound;Manual techniques;Gait training;Therapeutic activities;Therapeutic exercise;Neuromuscular re-education;Balance training;Patient/family education;Passive range of motion;Dry needling;Scar mobilization;Taping;Manual  lymph drainage    PT Next Visit Plan  continue with closed chain strength for left leg; nustep; squats;  soft tissue work and neural tension stretch; check pelvic alignment and tissue around the piriformis and pelvic alignment    PT Home Exercise Plan  Access Code: [email or web address]     Consulted and Agree with Plan of Care  Patient       Patient will benefit from skilled therapeutic  intervention in order to improve the following deficits and impairments:  Abnormal gait, Increased fascial restricitons, Pain, Decreased coordination, Decreased mobility, Increased muscle spasms, Postural dysfunction, Decreased activity tolerance, Decreased endurance, Decreased range of motion, Decreased strength, Difficulty walking  Visit Diagnosis: 1. Muscle weakness (generalized)   2. Cramp and spasm   3. Acute left-sided low back pain with left-sided sciatica        Problem List There are no active problems to display for this patient.   Eulis FosterCheryl Sahith Nurse, PT 06/26/18 11:49 AM   [facility] [address], [address] [place], [place], [address] Phone: [phone]   Fax:  [phone]  Name: Amy Ingram MRN: [id number] Date of Birth: [birth year]

## 2018-06-29 ENCOUNTER — Other Ambulatory Visit: Payer: Self-pay

## 2018-06-29 ENCOUNTER — Encounter: Payer: Self-pay | Admitting: Physical Therapy

## 2018-06-29 ENCOUNTER — Ambulatory Visit: Payer: PRIVATE HEALTH INSURANCE | Admitting: Physical Therapy

## 2018-06-29 DIAGNOSIS — R252 Cramp and spasm: Secondary | ICD-10-CM

## 2018-06-29 DIAGNOSIS — M6281 Muscle weakness (generalized): Secondary | ICD-10-CM

## 2018-06-29 DIAGNOSIS — M5442 Lumbago with sciatica, left side: Secondary | ICD-10-CM

## 2018-06-29 NOTE — Therapy (Signed)
[facility] [address], [address] [place], [place], [address] Phone: [phone]   Fax:  [phone]  Physical Therapy Treatment  Patient Details  Name: Amy Ingram MRN: [id number] Date of Birth: 16-Jul-1985 Referring Provider (PT): Dr. Estill BambergMark Dumonski   Encounter Date: 06/29/2018  PT End of Session - 06/29/18 1130    Visit Number  9    Date for PT Re-Evaluation  08/28/18    Authorization Type  approved 6 more visits on 06/06/2018    Authorization - Visit Number  9    Authorization - Number of Visits  15    PT Start Time  1045   came 15 min late   PT Stop Time  1130    PT Time Calculation (min)  45 min    Activity Tolerance  Patient tolerated treatment well;No increased pain    Behavior During Therapy  WFL for tasks assessed/performed       Past Medical History:  Diagnosis Date  . Allergy     Past Surgical History:  Procedure Laterality Date  . DENTAL SURGERY  2006   dental implants 2006  . SACROILIAC JOINT FUSION Left 01/18/2018   Procedure: LEFT SACROILIAC JOINT FUSION;  Surgeon: Estill Bambergumonski, Mark, MD;  Location: [facility];  Service: Orthopedics;  Laterality: Left;    There were no vitals filed for this visit.  Subjective Assessment - 06/29/18 1054    Subjective  I was sore after visit. I feel the shot is wearing off due to increased pain in left leg.    Patient Stated Goals  strengthening, improve ROM, get back to normal    Currently in Pain?  Yes    Pain Score  5     Pain Location  Leg    Pain Orientation  Left    Pain Descriptors / Indicators  Sore;Stabbing    Pain Onset  More than a month ago    Pain Frequency  Intermittent    Aggravating Factors   standing and bending over    Pain Relieving Factors  ice, heat    Multiple Pain Sites  No         [facility] PT Assessment - 06/29/18 0001      Assessment   Medical Diagnosis  s/p left SI joint fusion    Referring Provider (PT)  Dr. 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    Leisure  gym, riding horses      Cognition   Overall Cognitive Status  Within Functional Limits for tasks assessed      Posture/Postural Control   Posture/Postural Control  No significant limitations      AROM   Lumbar Extension  limited by 25%    Lumbar - Right Side Bend  full    Lumbar - Left Side Bend  full    Lumbar - Right Rotation  full    Lumbar - Left Rotation  full  PROM   Left Hip ABduction  full      Strength   Left Hip Flexion  3+/5    Left Hip External Rotation  4/5    Left Hip Internal Rotation  4/5    Left Hip ABduction  4/5    Left Hip ADduction  4/5    Right Knee Flexion  4+/5    Left Knee Flexion  4+/5    Left Knee Extension  4+/5                   [facility] Adult PT Treatment/Exercise - 06/29/18 0001      Lumbar Exercises: Aerobic   Nustep  level 2 4 min; seat #5, no arms   see left leg with effort     Lumbar Exercises: Machines for Strengthening   Leg Press  both legs 50# 3 x 10 working on control; SEat #7;  leftleg 5# 10x, tried 15 and 10# but increased pain   red band around knees to work on ER     Knee/Hip Exercises: Standing   Forward Lunges  Right;Left;10 reps    Lateral Step Up  Left;1 set;15 reps;Hand Hold: 2;Step Height: 4"    Other Standing Knee Exercises  stand on right and lift left knee and ER left hip      Manual Therapy   Manual Therapy  Soft tissue mobilization    Soft tissue mobilization  using assistive device to left lumbar, gluteals and hamstring       Trigger Point Dry Needling - 06/29/18 0001     Consent Given?  Yes    Muscles Treated Back/Hip  Gluteus minimus;Gluteus medius;Piriformis;Gluteus maximus    Gluteus Minimus Response  Twitch response elicited;Palpable increased muscle length    Gluteus Medius Response  Twitch response elicited;Palpable increased muscle length    Gluteus Maximus Response  Twitch response elicited;Palpable increased muscle length    Piriformis Response  Twitch response elicited;Palpable increased muscle length             PT Short Term Goals - 06/26/18 1112      PT SHORT TERM GOAL #2   Title  ability to ambulate with heel toe gait on the left with no tingling in left leg    Baseline  able to get left heel down with 95% less tingling and not constant; intermittently    Time  3    Period  Weeks    Status  On-going      PT SHORT TERM GOAL #3   Title  ability to perform transitional movements with pain level decreased >/= 25% due to engaging the core and pelvic floor    Baseline  40% better    Time  3    Period  Weeks    Status  Achieved    Target Date  03/22/18        PT Long Term Goals - 06/29/18 1134      PT LONG TERM GOAL #1   Title  independent with initial HEP    Time  6    Period  Weeks    Status  On-going      PT LONG TERM GOAL #2   Title  ability to sit with equal weight on bilateral buttocks due to pain decreased >/= 75%    Time  6    Period  Weeks    Status  On-going      PT LONG TERM GOAL #3   Title  able to walk in the community wiht minimal to no gait deficits and pain decreased >/= 75% and full weight on left leg    Time  6    Period  Weeks    Status  On-going      PT LONG TERM GOAL #4   Title  left hip strength 4+/5 so patient is able to go up and down stairs with step over step pattern    Time  6    Period  Weeks    Status  On-going      PT LONG TERM GOAL #5   Title  ability to squat and lift within her restrictions from the doctor with correct body mechanics and due to increased hip strength >/= 4+/5     Time  6    Period  Weeks    Status  On-going            Plan - 06/29/18 1130    Clinical Impression Statement  Patient has pai nwith movement is 40% better. Patient can walk with heel strike on left with tingling decreased by 95%. Patient is able to do more reps with her exercises. Patient has tightness in the left gluteal and piriformis. Patient has full left hip ROM now. Patient left hip strength is 4/5 with exception of left hip abduction at 3+/5. Patient has increased pelvic stability with her exercises. Patient will benefit from skilled threapy to improve muscle coordination, reduce triggger points, and increase strength.    Rehab Potential  Excellent    Clinical Impairments Affecting Rehab Potential  Left SI joint fusion 01/18/2018 with 10# lifting limit    PT Frequency  2x / week    PT Duration  8 weeks    PT Treatment/Interventions  Cryotherapy;Electrical Stimulation;Iontophoresis 4mg /ml Dexamethasone;Ultrasound;Manual techniques;Gait training;Therapeutic activities;Therapeutic exercise;Neuromuscular re-education;Balance training;Patient/family education;Passive range of motion;Dry needling;Scar mobilization;Taping;Manual lymph drainage    PT Next Visit Plan  continue with closed chain strength for left leg; nustep; squats;  soft tissue work and neural tension stretch; check pelvic alignment and tissue around the piriformis and pelvic alignment    PT Home Exercise Plan  Access Code: [email or web address]     Recommended Other Services  sent ME renewal note       Patient will benefit from skilled therapeutic intervention in order to improve the following deficits and impairments:  Abnormal gait, Increased fascial restricitons, Pain, Decreased coordination, Decreased mobility, Increased muscle spasms, Postural dysfunction, Decreased activity tolerance, Decreased endurance, Decreased range of motion, Decreased strength, Difficulty walking  Visit Diagnosis: 1. Muscle weakness (generalized)   2.  Cramp and spasm   3. Acute left-sided low back pain with left-sided sciatica        Problem List There are no active problems to display for this patient.   Earlie Counts, PT 06/29/18 11:37 AM   [facility] [address] [place], [place], [address] Phone: [phone]   Fax:  [phone]  Name: HERTHA GERGEN MRN: [id number] Date of Birth: 1985-01-12

## 2018-07-03 ENCOUNTER — Ambulatory Visit: Payer: PRIVATE HEALTH INSURANCE | Admitting: Physical Therapy

## 2018-07-03 ENCOUNTER — Encounter: Payer: Self-pay | Admitting: Physical Therapy

## 2018-07-03 ENCOUNTER — Other Ambulatory Visit: Payer: Self-pay

## 2018-07-03 DIAGNOSIS — M6281 Muscle weakness (generalized): Secondary | ICD-10-CM

## 2018-07-03 DIAGNOSIS — M5442 Lumbago with sciatica, left side: Secondary | ICD-10-CM

## 2018-07-03 DIAGNOSIS — R252 Cramp and spasm: Secondary | ICD-10-CM

## 2018-07-03 NOTE — Therapy (Signed)
[facility] [address], [address] [place], [place], [address] Phone: [phone]   Fax:  [phone]  Physical Therapy Treatment  Patient Details  Name: Amy Ingram MRN: [id number] Date of Birth: 1985-08-24 Referring Provider (PT): Dr. Estill BambergMark Dumonski   Encounter Date: 07/03/2018  PT End of Session - 07/03/18 1452    Visit Number  10    Date for PT Re-Evaluation  08/28/18    Authorization Type  approved 6 more visits on 06/06/2018    Authorization - Visit Number  10    Authorization - Number of Visits  15    PT Start Time  1400    PT Stop Time  1445    PT Time Calculation (min)  45 min    Activity Tolerance  Patient tolerated treatment well;No increased pain    Behavior During Therapy  WFL for tasks assessed/performed       Past Medical History:  Diagnosis Date  . Allergy     Past Surgical History:  Procedure Laterality Date  . DENTAL SURGERY  2006   dental implants 2006  . SACROILIAC JOINT FUSION Left 01/18/2018   Procedure: LEFT SACROILIAC JOINT FUSION;  Surgeon: Estill Bambergumonski, Mark, MD;  Location: [facility];  Service: Orthopedics;  Laterality: Left;    There were no vitals filed for this visit.  Subjective Assessment - 07/03/18 1404    Subjective  The dry needling and massager helped the pain and loosened the area up. I went to the doctor but it was changed 07/10/2018. I feel good right now. I was able to go to work for several hours. The left leg pain is internittent and more intense compared to before.    Patient Stated Goals  strengthening, improve ROM, get back to normal    Currently in Pain?  Yes    Pain Score  4     Pain Location  Leg    Pain Orientation  Left    Pain Descriptors / Indicators  Sore;Stabbing    Pain Type  Chronic pain    Pain Onset  More than a month ago    Pain Frequency  Intermittent    Aggravating Factors   standing and bending over    Pain Relieving Factors  ice, heat    Multiple Pain Sites   No                       [facility] Adult PT Treatment/Exercise - 07/03/18 0001      Lumbar Exercises: Aerobic   Nustep  level 2 6 min; seat #5, no arms   see left leg with effort     Lumbar Exercises: Machines for Strengthening   Leg Press  both legs 55# 3 x 10 working on control; SEat #7;  leftleg 5# 15x2 tried  10# but increased pain   red band around knees to work on ER     Knee/Hip Exercises: Standing   Lateral Step Up  Left;1 set;15 reps;Step Height: 4";Hand Hold: 1    Forward Step Up  Left;1 set;15 reps;Step Height: 4";Hand Hold: 1   no pain   Wall Squat  1 set;10 reps;3 seconds   facing wall moving hands upward   Wall Squat Limitations  VC to flex hips    Gait Training  side step with yellow band around the knees 20 feet 3 times    Other Standing Knee Exercises  stand on right and lift left knee and  ER left hip    Other Standing Knee Exercises  stand on flat side of BOSU ball with mini squats working on legs and core      Manual Therapy   Manual Therapy  Soft tissue mobilization    Soft tissue mobilization  using assistive device to left lumbar, gluteals and hamstring               PT Short Term Goals - 07/03/18 1455      PT SHORT TERM GOAL #1   Title  independent with initial HEP    Time  3    Period  Weeks    Status  Achieved    Target Date  03/22/18      PT SHORT TERM GOAL #2   Title  ability to ambulate with heel toe gait on the left with no tingling in left leg    Baseline  able to get left heel down with 95% less tingling and not constant; intermittently    Time  3    Period  Weeks    Status  Achieved    Target Date  03/22/18      PT SHORT TERM GOAL #3   Title  ability to perform transitional movements with pain level decreased >/= 25% due to engaging the core and pelvic floor    Time  3    Period  Weeks    Status  Achieved    Target Date  03/22/18        PT Long Term Goals - 07/03/18 1456      PT LONG TERM GOAL #1   Title   independent with initial HEP    Time  6    Period  Weeks    Status  On-going      PT LONG TERM GOAL #2   Title  ability to sit with equal weight on bilateral buttocks due to pain decreased >/= 75%    Time  6    Period  Weeks    Status  On-going      PT LONG TERM GOAL #3   Title  able to walk in the community wiht minimal to no gait deficits and pain decreased >/= 75% and full weight on left leg    Period  Weeks    Status  On-going      PT LONG TERM GOAL #4   Title  left hip strength 4+/5 so patient is able to go up and down stairs with step over step pattern    Time  6    Period  Weeks    Status  On-going      PT LONG TERM GOAL #5   Title  ability to squat and lift within her restrictions from the doctor with correct body mechanics and due to increased hip strength >/= 4+/5    Time  6    Period  Weeks    Status  On-going            Plan - 07/03/18 1401    Clinical Impression Statement  Patient pain is now intermittent and sharp. Patient has increased feeling in anterior right thigh. Patient is able to keep her pelvic leveled with her step exercises. Patient has increased pelvic motion when standing on the falt side of the BOSU ball. Patient needs tactile cues to flex her hips when going into a squat position. Patient muscle relax better if she is massaged at end of treatment. Pateint will benefit from  skilled therapy to improve muscle coordination, reduce trigger points, and increase strength.    Rehab Potential  Excellent    Clinical Impairments Affecting Rehab Potential  Left SI joint fusion 01/18/2018 with 10# lifting limit    PT Frequency  2x / week    PT Duration  8 weeks    PT Treatment/Interventions  Cryotherapy;Electrical Stimulation;Iontophoresis 4mg /ml Dexamethasone;Ultrasound;Manual techniques;Gait training;Therapeutic activities;Therapeutic exercise;Neuromuscular re-education;Balance training;Patient/family education;Passive range of motion;Dry needling;Scar  mobilization;Taping;Manual lymph drainage    PT Next Visit Plan  continue with closed chain strength for left leg; nustep; squats;  soft tissue work and neural tension stretch; check pelvic alignment and tissue around the piriformis and pelvic alignment    PT Home Exercise Plan  Access Code: [email or web address]     Consulted and Agree with Plan of Care  Patient       Patient will benefit from skilled therapeutic intervention in order to improve the following deficits and impairments:  Abnormal gait, Increased fascial restricitons, Pain, Decreased coordination, Decreased mobility, Increased muscle spasms, Postural dysfunction, Decreased activity tolerance, Decreased endurance, Decreased range of motion, Decreased strength, Difficulty walking  Visit Diagnosis: 1. Muscle weakness (generalized)   2. Cramp and spasm   3. Acute left-sided low back pain with left-sided sciatica        Problem List There are no active problems to display for this patient.   Eulis FosterCheryl Chino Sardo, PT 07/03/18 2:57 PM   [facility] [address], [address] [place], [place], [address] Phone: [phone]   Fax:  [phone]  Name: Amy Ingram MRN: [id number] Date of Birth: [birth year]

## 2018-07-05 ENCOUNTER — Encounter: Payer: Self-pay | Admitting: Physical Therapy

## 2018-07-05 ENCOUNTER — Ambulatory Visit: Payer: PRIVATE HEALTH INSURANCE | Attending: Orthopedic Surgery | Admitting: Physical Therapy

## 2018-07-05 ENCOUNTER — Other Ambulatory Visit: Payer: Self-pay

## 2018-07-05 DIAGNOSIS — M6281 Muscle weakness (generalized): Secondary | ICD-10-CM | POA: Diagnosis not present

## 2018-07-05 DIAGNOSIS — R252 Cramp and spasm: Secondary | ICD-10-CM | POA: Diagnosis present

## 2018-07-05 DIAGNOSIS — M5442 Lumbago with sciatica, left side: Secondary | ICD-10-CM | POA: Diagnosis present

## 2018-07-05 NOTE — Therapy (Signed)
[facility] [address] [place], [place], [address] Phone: [phone]   Fax:  [phone]  Physical Therapy Treatment  Patient Details  Name: LYRICK WORLAND MRN: [id number] Date of Birth: Feb 01, 1985 Referring Provider (PT): Dr. Phylliss Bob   Encounter Date: 07/05/2018  PT End of Session - 07/05/18 1142    Visit Number  11    Date for PT Re-Evaluation  08/28/18    Authorization Type  approved 6 more visits on 06/06/2018    Authorization - Visit Number  11    Authorization - Number of Visits  15    PT Start Time  1100    PT Stop Time  3329    PT Time Calculation (min)  38 min    Activity Tolerance  Patient tolerated treatment well;No increased pain    Behavior During Therapy  WFL for tasks assessed/performed       Past Medical History:  Diagnosis Date  . Allergy     Past Surgical History:  Procedure Laterality Date  . DENTAL SURGERY  2006   dental implants 2006  . SACROILIAC JOINT FUSION Left 01/18/2018   Procedure: LEFT SACROILIAC JOINT FUSION;  Surgeon: Phylliss Bob, MD;  Location: [place];  Service: Orthopedics;  Laterality: Left;    There were no vitals filed for this visit.  Subjective Assessment - 07/05/18 1110    Subjective  I am sore and tired from extra exercises and working.    Patient Stated Goals  strengthening, improve ROM, get back to normal    Currently in Pain?  Yes    Pain Score  6     Pain Location  Leg    Pain Orientation  Left    Pain Descriptors / Indicators  Sore;Stabbing    Pain Type  Chronic pain    Pain Onset  More than a month ago    Pain Frequency  Intermittent    Aggravating Factors   standing and bending over    Pain Relieving Factors  ice, heat    Multiple Pain Sites  No                       [facility] Adult PT Treatment/Exercise - 07/05/18 0001      Lumbar Exercises: Aerobic   Nustep  level 2 6 min; seat #5, no arms   see left leg with effort     Lumbar Exercises: Machines for Strengthening   Leg Press  both legs 55# 3 x 10 working on control; SEat #7;  left leg 10# 10x   red band around knees to work on ER     Knee/Hip Exercises: Standing   Forward Lunges  Right;Left;1 set;10 reps   moving red band outward and across body   Lateral Step Up  Left;1 set;15 reps;Step Height: 4";Hand Hold: 1    Forward Step Up  Left;1 set;15 reps;Step Height: 4";Hand Hold: 1   no pain   Wall Squat  1 set;3 seconds;5 reps   facing wall moving hands upward   Wall Squat Limitations  VC to flex hips and weight on hips    Gait Training  side step with yellow band around the knees 20 feet 3 times    Other Standing Knee Exercises  stand on flat side of BOSU ball with mini squats working on legs and core      Manual Therapy   Manual Therapy  Soft tissue mobilization  Soft tissue mobilization  using assistive device to left lumbar, gluteals and hamstring               PT Short Term Goals - 07/03/18 1455      PT SHORT TERM GOAL #1   Title  independent with initial HEP    Time  3    Period  Weeks    Status  Achieved    Target Date  03/22/18      PT SHORT TERM GOAL #2   Title  ability to ambulate with heel toe gait on the left with no tingling in left leg    Baseline  able to get left heel down with 95% less tingling and not constant; intermittently    Time  3    Period  Weeks    Status  Achieved    Target Date  03/22/18      PT SHORT TERM GOAL #3   Title  ability to perform transitional movements with pain level decreased >/= 25% due to engaging the core and pelvic floor    Time  3    Period  Weeks    Status  Achieved    Target Date  03/22/18        PT Long Term Goals - 07/03/18 1456      PT LONG TERM GOAL #1   Title  independent with initial HEP    Time  6    Period  Weeks    Status  On-going      PT LONG TERM GOAL #2   Title  ability to sit with equal weight on bilateral buttocks due to pain decreased >/= 75%    Time   6    Period  Weeks    Status  On-going      PT LONG TERM GOAL #3   Title  able to walk in the community wiht minimal to no gait deficits and pain decreased >/= 75% and full weight on left leg    Period  Weeks    Status  On-going      PT LONG TERM GOAL #4   Title  left hip strength 4+/5 so patient is able to go up and down stairs with step over step pattern    Time  6    Period  Weeks    Status  On-going      PT LONG TERM GOAL #5   Title  ability to squat and lift within her restrictions from the doctor with correct body mechanics and due to increased hip strength >/= 4+/5    Time  6    Period  Weeks    Status  On-going            Plan - 07/05/18 1142    Clinical Impression Statement  Patient is doing more exercise in therapy and walking more due to increased endurance and strength. Patient is doing better with pain if she is having a massage at end of treatment. Patient is walking without deficits. Patient will benefit from skilled therapy to improve muscle coordination, reduce trigger points, and increase strength.    Rehab Potential  Excellent    Clinical Impairments Affecting Rehab Potential  Left SI joint fusion 01/18/2018 with 10# lifting limit    PT Frequency  2x / week    PT Duration  8 weeks    PT Treatment/Interventions  Cryotherapy;Electrical Stimulation;Iontophoresis 4mg /ml Dexamethasone;Ultrasound;Manual techniques;Gait training;Therapeutic activities;Therapeutic exercise;Neuromuscular re-education;Balance training;Patient/family education;Passive range of motion;Dry needling;Scar  mobilization;Taping;Manual lymph drainage    PT Next Visit Plan  continue with closed chain strength for left leg; nustep; squats;  soft tissue work and neural tension stretch; check pelvic alignment and tissue around the piriformis and pelvic alignment    PT Home Exercise Plan  Access Code: [email or web address]     Consulted and Agree with Plan of Care  Patient       Patient will benefit from  skilled therapeutic intervention in order to improve the following deficits and impairments:  Abnormal gait, Increased fascial restricitons, Pain, Decreased coordination, Decreased mobility, Increased muscle spasms, Postural dysfunction, Decreased activity tolerance, Decreased endurance, Decreased range of motion, Decreased strength, Difficulty walking  Visit Diagnosis: 1. Muscle weakness (generalized)   2. Cramp and spasm   3. Acute left-sided low back pain with left-sided sciatica        Problem List There are no active problems to display for this patient.   Eulis FosterCheryl Tynisa Vohs, PT 07/05/18 11:47 AM   [facility] [address], [address] [place], [place], [address] Phone: [phone]   Fax:  [phone]  Name: Jillyn HiddenHeather R Sliger MRN: [id number] Date of Birth: [birth year]

## 2018-07-10 ENCOUNTER — Ambulatory Visit: Payer: PRIVATE HEALTH INSURANCE | Admitting: Physical Therapy

## 2018-07-11 ENCOUNTER — Ambulatory Visit: Payer: PRIVATE HEALTH INSURANCE | Admitting: Physical Therapy

## 2018-07-13 ENCOUNTER — Other Ambulatory Visit: Payer: Self-pay

## 2018-07-13 ENCOUNTER — Ambulatory Visit: Payer: PRIVATE HEALTH INSURANCE | Admitting: Physical Therapy

## 2018-07-13 ENCOUNTER — Telehealth: Payer: Self-pay | Admitting: Physical Therapy

## 2018-07-13 ENCOUNTER — Encounter: Payer: Self-pay | Admitting: Physical Therapy

## 2018-07-13 DIAGNOSIS — R252 Cramp and spasm: Secondary | ICD-10-CM

## 2018-07-13 DIAGNOSIS — M5442 Lumbago with sciatica, left side: Secondary | ICD-10-CM

## 2018-07-13 DIAGNOSIS — M6281 Muscle weakness (generalized): Secondary | ICD-10-CM | POA: Diagnosis not present

## 2018-07-13 NOTE — Telephone Encounter (Signed)
Open by accident Earlie Counts, PT @7 /10/2018@ 11:21 AM

## 2018-07-13 NOTE — Therapy (Signed)
[facility] [address], [place], [place], [address] Phone: [phone]   Fax:  [phone]  Physical Therapy Treatment  Patient Details  Name: Amy Ingram MRN: [id number] Date of Birth: 06-Jan-1985 Referring Provider (PT): Dr. Phylliss Bob   Encounter Date: 07/13/2018  PT End of Session - 07/13/18 0935    Visit Number  12    Date for PT Re-Evaluation  08/28/18    Authorization Type  approved 6 more visits on 06/06/2018    Authorization - Visit Number  12    Authorization - Number of Visits  15    PT Start Time  0930    PT Stop Time  1015    PT Time Calculation (min)  45 min    Activity Tolerance  Patient tolerated treatment well;No increased pain    Behavior During Therapy  WFL for tasks assessed/performed       Past Medical History:  Diagnosis Date  . Allergy     Past Surgical History:  Procedure Laterality Date  . DENTAL SURGERY  2006   dental implants 2006  . SACROILIAC JOINT FUSION Left 01/18/2018   Procedure: LEFT SACROILIAC JOINT FUSION;  Surgeon: Phylliss Bob, MD;  Location: [place];  Service: Orthopedics;  Laterality: Left;    There were no vitals filed for this visit.  Subjective Assessment - 07/13/18 0934    Subjective  I am sore today. I saw the doctor and they want me to do work conditioning.    Patient Stated Goals  strengthening, improve ROM, get back to normal    Currently in Pain?  Yes    Pain Score  6     Pain Location  Leg    Pain Orientation  Left    Pain Descriptors / Indicators  Stabbing;Sore    Pain Type  Chronic pain    Pain Radiating Towards  to left knee    Pain Onset  More than a month ago    Pain Frequency  Intermittent    Aggravating Factors   standing and bending over    Pain Relieving Factors  ice, heat    Multiple Pain Sites  No                       [facility] Adult PT Treatment/Exercise - 07/13/18 0001      Lumbar Exercises: Aerobic   Nustep   level 2 6 min; seat #5, no arms   see left leg with effort     Lumbar Exercises: Machines for Strengthening   Leg Press  both legs 55# 3 x 10 working on control; SEat #7;  left leg 10# 10x      Lumbar Exercises: Quadruped   Opposite Arm/Leg Raise  Right arm/Left leg;Left arm/Right leg;10 reps;1 second    Opposite Arm/Leg Raise Limitations  tactile cues to keep spinal nuetral and monitoring for pain       Knee/Hip Exercises: Standing   Lateral Step Up  Left;1 set;15 reps;Step Height: 4";Hand Hold: 1    Forward Step Up  Left;1 set;15 reps;Step Height: 4";Hand Hold: 1   no pain   Functional Squat  2 sets;5 reps   1 set holding red theraband; holding yellow theraband   Wall Squat  1 set;3 seconds;5 reps   facing wall moving hands upward   Wall Squat Limitations  VC to flex hips and weight on hips    Other Standing Knee Exercises  tower 20#  pull forward and back with weight shifting 5x with pain then decreased to 15# working on weight sihfting    Other Standing Knee Exercises  stand on flat side of BOSU ball with mini squats working on legs and core      Manual Therapy   Manual Therapy  Soft tissue mobilization    Soft tissue mobilization  using assistive device to left lumbar, gluteals and hamstring               PT Short Term Goals - 07/03/18 1455      PT SHORT TERM GOAL #1   Title  independent with initial HEP    Time  3    Period  Weeks    Status  Achieved    Target Date  03/22/18      PT SHORT TERM GOAL #2   Title  ability to ambulate with heel toe gait on the left with no tingling in left leg    Baseline  able to get left heel down with 95% less tingling and not constant; intermittently    Time  3    Period  Weeks    Status  Achieved    Target Date  03/22/18      PT SHORT TERM GOAL #3   Title  ability to perform transitional movements with pain level decreased >/= 25% due to engaging the core and pelvic floor    Time  3    Period  Weeks    Status  Achieved     Target Date  03/22/18        PT Long Term Goals - 07/13/18 1031      PT LONG TERM GOAL #1   Title  independent with initial HEP    Time  6    Period  Weeks    Status  On-going      PT LONG TERM GOAL #2   Title  ability to sit with equal weight on bilateral buttocks due to pain decreased >/= 75%    Time  6    Period  Weeks    Status  On-going      PT LONG TERM GOAL #3   Title  able to walk in the community wiht minimal to no gait deficits and pain decreased >/= 75% and full weight on left leg    Time  6    Period  Weeks    Status  On-going      PT LONG TERM GOAL #4   Title  left hip strength 4+/5 so patient is able to go up and down stairs with step over step pattern    Time  6    Period  Weeks    Status  On-going      PT LONG TERM GOAL #5   Title  ability to squat and lift within her restrictions from the doctor with correct body mechanics and due to increased hip strength >/= 4+/5    Time  6    Period  Weeks    Status  On-going            Plan - 07/13/18 1024    Clinical Impression Statement  Patient left leg pain is intermittent. Patient has increased pain in lumbar sacral area when lift 1-2 pounds to chest and overhead, Patient is doing better with leg press and has less fatique. Patient needs tactile cues to keep spinal neutral with lifting and quadruped exercise. Patient saw the doctor on  07/10/18. 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 Patient will benefit from skilled therapy to improve muscle coordination, reduce trigger points, and increase strength.    Rehab Potential  Excellent    Clinical Impairments Affecting Rehab Potential  Left SI joint fusion 01/18/2018 with 10# lifting limit    PT Frequency  2x / week    PT Duration  8 weeks    PT Treatment/Interventions  Cryotherapy;Electrical Stimulation;Iontophoresis 4mg /ml Dexamethasone;Ultrasound;Manual techniques;Gait  training;Therapeutic activities;Therapeutic exercise;Neuromuscular re-education;Balance training;Patient/family education;Passive range of motion;Dry needling;Scar mobilization;Taping;Manual lymph drainage    PT Next Visit Plan  continue with closed chain strength for left leg; nustep; squats;  soft tissue work and neural tension stretch; work on squatting to prepare for lifting, work on Ray[employer]onweight shifting, back strength    PT Home Exercise Plan  Access Code: [email or web address]     Consulted and Agree with Plan of Care  Patient       Patient will benefit from skilled therapeutic intervention in order to improve the following deficits and impairments:  Abnormal gait, Increased fascial restricitons, Pain, Decreased coordination, Decreased mobility, Increased muscle spasms, Postural dysfunction, Decreased activity tolerance, Decreased endurance, Decreased range of motion, Decreased strength, Difficulty walking  Visit Diagnosis: 1. Muscle weakness (generalized)   2. Cramp and spasm   3. Acute left-sided low back pain with left-sided sciatica        Problem List There are no active problems to display for this patient.   Eulis FosterCheryl Amada Hallisey, PT 07/13/18 10:33 AM   [facility] [address], [address] [place], [place], [address] Phone: [phone]   Fax:  [phone]  Name: Amy Ingram MRN: [id number] Date of Birth: 07-22-1985

## 2018-07-13 NOTE — Telephone Encounter (Signed)
No items to report Earlie Counts, PT @7 /10/20@ 11:20 AM

## 2018-07-17 ENCOUNTER — Ambulatory Visit: Payer: PRIVATE HEALTH INSURANCE | Admitting: Physical Therapy

## 2018-07-17 ENCOUNTER — Encounter: Payer: Self-pay | Admitting: Physical Therapy

## 2018-07-17 ENCOUNTER — Other Ambulatory Visit: Payer: Self-pay

## 2018-07-17 DIAGNOSIS — R252 Cramp and spasm: Secondary | ICD-10-CM

## 2018-07-17 DIAGNOSIS — M5442 Lumbago with sciatica, left side: Secondary | ICD-10-CM

## 2018-07-17 DIAGNOSIS — M6281 Muscle weakness (generalized): Secondary | ICD-10-CM

## 2018-07-17 NOTE — Therapy (Signed)
[facility] [address], [address] [place], [place], [address] Phone: [phone]   Fax:  [phone]  Physical Therapy Treatment  Patient Details  Name: Amy Ingram MRN: [id number] Date of Birth: December 04, 1985 Referring Provider (PT): Dr. Estill BambergMark Dumonski   Encounter Date: 07/17/2018  PT End of Session - 07/17/18 1124    Visit Number  13    Date for PT Re-Evaluation  08/28/18    Authorization Type  approved 7 more visit on 07/17/2018    Authorization - Visit Number  13    Authorization - Number of Visits  22    PT Start Time  1100    PT Stop Time  1145    PT Time Calculation (min)  45 min    Activity Tolerance  Patient tolerated treatment well;No increased pain    Behavior During Therapy  WFL for tasks assessed/performed       Past Medical History:  Diagnosis Date  . Allergy     Past Surgical History:  Procedure Laterality Date  . DENTAL SURGERY  2006   dental implants 2006  . SACROILIAC JOINT FUSION Left 01/18/2018   Procedure: LEFT SACROILIAC JOINT FUSION;  Surgeon: Estill Bambergumonski, Mark, MD;  Location[facility];  Service: Orthopedics;  Laterality: Left;    There were no vitals filed for this visit.  Subjective Assessment - 07/17/18 1105    Subjective  The last 2 exercises from last visit caused increased back pain and leg pain. [place] work [place] in [place]. I am scheduled here till the end of August.    Patient Stated Goals  strengthening, improve ROM, get back to normal    Currently in Pain?  Yes    Pain Score  5     Pain Location  Leg    Pain Orientation  Left    Pain Descriptors / Indicators  Sharp    Pain Type  Chronic pain    Pain Radiating Towards  to left knee    Pain Onset  More than a month ago    Pain Frequency  Intermittent    Aggravating Factors   standing and bending over    Pain Relieving Factors  ice, heat    Multiple Pain Sites  Yes    Pain Score  7    Pain  Location  Back    Pain Orientation  Lower    Pain Descriptors / Indicators  Aching    Pain Type  Chronic pain    Pain Onset  More than a month ago    Pain Frequency  Constant    Aggravating Factors   being upright    Pain Relieving Factors  laying down                       [facility] Adult PT Treatment/Exercise - 07/17/18 0001      Lumbar Exercises: Stretches   Active Hamstring Stretch  Right;Left;1 rep;30 seconds   sitting   Single Knee to Chest Stretch  Right;Left;1 rep;30 seconds   supine   Lower Trunk Rotation  4 reps;20 seconds   supine rocking knees   Piriformis Stretch  Right;Left;1 rep;30 seconds   supine     Lumbar Exercises: Aerobic   Nustep  level 2 on hill program for 4 minutes, seat #5 no arms      Lumbar Exercises: Supine   Bent Knee Raise  20 reps;1 second  Dead Bug  20 reps;1 second    Bridge  10 reps    Bridge Limitations  lift one vertebrae at a time within painfree range    Isometric Hip Flexion  10 reps;3 seconds   supine   Isometric Hip Flexion Limitations  10x straight, 10 diagonal      Knee/Hip Exercises: Sidelying   Clams  10x each side; VC on no moving pelvis and abdominal bracing      Manual Therapy   Manual Therapy  Soft tissue mobilization    Soft tissue mobilization  using assistive device to left lumbar, gluteals        Trigger Point Dry Needling - 07/17/18 0001    Consent Given?  Yes    Muscles Treated Back/Hip  Gluteus minimus;Gluteus medius;Piriformis;Quadratus lumborum    Gluteus Minimus Response  Twitch response elicited;Palpable increased muscle length    Gluteus Medius Response  Twitch response elicited;Palpable increased muscle length    Piriformis Response  Twitch response elicited;Palpable increased muscle length    Quadratus Lumborum Response  Twitch response elicited;Palpable increased muscle length             PT Short Term Goals - 07/03/18 1455      PT SHORT TERM GOAL #1   Title  independent with  initial HEP    Time  3    Period  Weeks    Status  Achieved    Target Date  03/22/18      PT SHORT TERM GOAL #2   Title  ability to ambulate with heel toe gait on the left with no tingling in left leg    Baseline  able to get left heel down with 95% less tingling and not constant; intermittently    Time  3    Period  Weeks    Status  Achieved    Target Date  03/22/18      PT SHORT TERM GOAL #3   Title  ability to perform transitional movements with pain level decreased >/= 25% due to engaging the core and pelvic floor    Time  3    Period  Weeks    Status  Achieved    Target Date  03/22/18        PT Long Term Goals - 07/13/18 1031      PT LONG TERM GOAL #1   Title  independent with initial HEP    Time  6    Period  Weeks    Status  On-going      PT LONG TERM GOAL #2   Title  ability to sit with equal weight on bilateral buttocks due to pain decreased >/= 75%    Time  6    Period  Weeks    Status  On-going      PT LONG TERM GOAL #3   Title  able to walk in the community wiht minimal to no gait deficits and pain decreased >/= 75% and full weight on left leg    Time  6    Period  Weeks    Status  On-going      PT LONG TERM GOAL #4   Title  left hip strength 4+/5 so patient is able to go up and down stairs with step over step pattern    Time  6    Period  Weeks    Status  On-going      PT LONG TERM GOAL #5   Title  ability  to squat and lift within her restrictions from the doctor with correct body mechanics and due to increased hip strength >/= 4+/5    Time  6    Period  Weeks    Status  On-going            Plan - 07/17/18 1147    Clinical Impression Statement  Patient had increased pain from last visit with working on lifting 2 pounds. Today we worked on lumbar stabilization to calm her pain down. Patient was a 5/10 after therapy. Patient had trigger points in her left quadratus and piriformis. 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 Patient will benefit from skilled therapy to improve muscle coordination, reduce trigger points, and increase strength.    Rehab Potential  Excellent    Clinical Impairments Affecting Rehab Potential  Left SI joint fusion 01/18/2018 with 10# lifting limit    PT Frequency  2x / week    PT Duration  8 weeks    PT Treatment/Interventions  Cryotherapy;Electrical Stimulation;Iontophoresis 4mg /ml Dexamethasone;Ultrasound;Manual techniques;Gait training;Therapeutic activities;Therapeutic exercise;Neuromuscular re-education;Balance training;Patient/family education;Passive range of motion;Dry needling;Scar mobilization;Taping;Manual lymph drainage    PT Next Visit Plan  continue with closed chain strength for left leg; nustep; squats;  soft tissue work and neural tension stretch; work on squatting  without weight to prepare for lifting, work on Ray[employer]weight shifting, back strength for HEP    PT Home Exercise Plan  Access Code: [email or web address]     Consulted and Agree with Plan of Care  Patient       Patient will benefit from skilled therapeutic intervention in order to improve the following deficits and impairments:  Abnormal gait, Increased fascial restricitons, Pain, Decreased coordination, Decreased mobility, Increased muscle spasms, Postural dysfunction, Decreased activity tolerance, Decreased endurance, Decreased range of motion, Decreased strength, Difficulty walking  Visit Diagnosis: 1. Muscle weakness (generalized)   2. Cramp and spasm   3. Acute left-sided low back pain with left-sided sciatica        Problem List There are no active problems to display for this patient.   Eulis FosterCheryl Gray, PT 07/17/18 11:54 AM   [facility] [address], [address] [place], [place], [address] Phone: [phone]   Fax:  [phone]  Name: Amy Ingram MRN: [id number] Date of Birth: Oct 05, 1985

## 2018-07-20 ENCOUNTER — Encounter: Payer: Self-pay | Admitting: Physical Therapy

## 2018-07-20 ENCOUNTER — Ambulatory Visit: Payer: PRIVATE HEALTH INSURANCE | Admitting: Physical Therapy

## 2018-07-20 ENCOUNTER — Other Ambulatory Visit: Payer: Self-pay

## 2018-07-20 DIAGNOSIS — R252 Cramp and spasm: Secondary | ICD-10-CM

## 2018-07-20 DIAGNOSIS — M5442 Lumbago with sciatica, left side: Secondary | ICD-10-CM

## 2018-07-20 DIAGNOSIS — M6281 Muscle weakness (generalized): Secondary | ICD-10-CM

## 2018-07-20 NOTE — Therapy (Signed)
[facility] [address], [address] [place], [place], [address] Phone: [phone]   Fax:  [phone]  Physical Therapy Treatment  Patient Details  Name: Amy HiddenHeather R Zanella MRN: [id number] Date of Birth: February 05, 1985 Referring Provider (PT): Dr. Estill BambergMark Dumonski   Encounter Date: 07/20/2018  PT End of Session - 07/20/18 1118    Visit Number  14    Date for PT Re-Evaluation  08/28/18    Authorization Type  approved 7 more visit on 07/17/2018    Authorization - Visit Number  14    Authorization - Number of Visits  22    PT Start Time  1030    PT Stop Time  1115    PT Time Calculation (min)  45 min    Equipment Utilized During Treatment  Other (comment)    Activity Tolerance  Patient tolerated treatment well;No increased pain    Behavior During Therapy  WFL for tasks assessed/performed       Past Medical History:  Diagnosis Date  . Allergy     Past Surgical History:  Procedure Laterality Date  . DENTAL SURGERY  2006   dental implants 2006  . SACROILIAC JOINT FUSION Left 01/18/2018   Procedure: LEFT SACROILIAC JOINT FUSION;  Surgeon: Estill Bambergumonski, Mark, MD;  Location: [facility];  Service: Orthopedics;  Laterality: Left;    There were no vitals filed for this visit.  Subjective Assessment - 07/20/18 1033    Subjective  the stretching and core work has helped my soreness.    Patient Stated Goals  strengthening, improve ROM, get back to normal    Currently in Pain?  Yes    Pain Score  4     Pain Location  Leg    Pain Orientation  Left    Pain Descriptors / Indicators  Sharp    Pain Type  Chronic pain    Pain Onset  More than a month ago    Pain Frequency  Intermittent    Aggravating Factors   standing and bending over    Pain Relieving Factors  ice, heat    Multiple Pain Sites  No                       [facility] Adult PT Treatment/Exercise - 07/20/18 0001      Lumbar Exercises: Stretches   Active Hamstring Stretch   Right;Left;1 rep;30 seconds   sitting   Passive Hamstring Stretch  Right;Left;1 rep;30 seconds   supine with strap   Piriformis Stretch  Right;Left;1 rep;30 seconds   supine     Lumbar Exercises: Aerobic   Nustep  level 2 for 6 minutes; seat#7; no arms      Lumbar Exercises: Machines for Strengthening   Leg Press  both legs 55# 3 x 10 working on control; SEat #7;  left leg 10# 10x      Lumbar Exercises: Supine   Bent Knee Raise  20 reps;1 second    Dead Bug  20 reps;1 second    Bridge  10 reps    Bridge Limitations  lift one vertebrae at a time within painfree range      Knee/Hip Exercises: Standing   Lateral Step Up  Left;1 set;15 reps;Step Height: 4";Hand Hold: 1    Forward Step Up  Left;1 set;15 reps;Step Height: 4";Hand Hold: 1   no pain   Wall Squat  1 set;3 seconds;5 reps   facing wall moving hands upward   Wall  Squat Limitations  VC to flex hips and weight on hips      Manual Therapy   Manual Therapy  Soft tissue mobilization    Soft tissue mobilization  using assistive device to left lumbar, gluteals                PT Short Term Goals - 07/03/18 1455      PT SHORT TERM GOAL #1   Title  independent with initial HEP    Time  3    Period  Weeks    Status  Achieved    Target Date  03/22/18      PT SHORT TERM GOAL #2   Title  ability to ambulate with heel toe gait on the left with no tingling in left leg    Baseline  able to get left heel down with 95% less tingling and not constant; intermittently    Time  3    Period  Weeks    Status  Achieved    Target Date  03/22/18      PT SHORT TERM GOAL #3   Title  ability to perform transitional movements with pain level decreased >/= 25% due to engaging the core and pelvic floor    Time  3    Period  Weeks    Status  Achieved    Target Date  03/22/18        PT Long Term Goals - 07/20/18 1123      PT LONG TERM GOAL #1   Title  independent with initial HEP    Time  6    Period  Weeks    Status   On-going      PT LONG TERM GOAL #2   Title  ability to sit with equal weight on bilateral buttocks due to pain decreased >/= 75%    Time  6    Period  Weeks    Status  On-going      PT LONG TERM GOAL #3   Title  able to walk in the community wiht minimal to no gait deficits and pain decreased >/= 75% and full weight on left leg    Time  6    Period  Weeks    Status  On-going      PT LONG TERM GOAL #4   Title  left hip strength 4+/5 so patient is able to go up and down stairs with step over step pattern    Time  6    Period  Weeks    Status  On-going      PT LONG TERM GOAL #5   Title  ability to squat and lift within her restrictions from the doctor with correct body mechanics and due to increased hip strength >/= 4+/5    Time  6    Period  Weeks    Status  On-going            Plan - 07/20/18 1119    Clinical Impression Statement  Patient was able to resume her exercises today without increased pain. During exercises she would report her left leg felt weak. Patient is able to squat correctly if she keeps the movement small. Patient continues to need core stability and left leg strength. 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 Patient will benefit from skilled therapy to improve muscle coordination, reduce trigger points, and increase strength.    Clinical Impairments Affecting Rehab Potential  Left  SI joint fusion 01/18/2018 with 10# lifting limit    PT Frequency  2x / week    PT Duration  8 weeks    PT Treatment/Interventions  Cryotherapy;Electrical Stimulation;Iontophoresis 4mg /ml Dexamethasone;Ultrasound;Manual techniques;Gait training;Therapeutic activities;Therapeutic exercise;Neuromuscular re-education;Balance training;Patient/family education;Passive range of motion;Dry needling;Scar mobilization;Taping;Manual lymph drainage    PT Next Visit Plan  continue with closed chain strength for left leg; nustep; squats;  soft  tissue work and neural tension stretch; work on squatting  without weight to prepare for lifting, work on [employer] shifting, back strength for HEP    PT Home Exercise Plan  Access Code: [email or web address]     Recommended Other Services  MD signed all notes    Consulted and Agree with Plan of Care  Patient       Patient will benefit from skilled therapeutic intervention in order to improve the following deficits and impairments:  Abnormal gait, Increased fascial restricitons, Pain, Decreased coordination, Decreased mobility, Increased muscle spasms, Postural dysfunction, Decreased activity tolerance, Decreased endurance, Decreased range of motion, Decreased strength, Difficulty walking  Visit Diagnosis: 1. Muscle weakness (generalized)   2. Cramp and spasm   3. Acute left-sided low back pain with left-sided sciatica        Problem List There are no active problems to display for this patient.   Earlie Counts, PT 07/20/18 11:24 AM   [facility] [address] [place], [place], [address] Phone: [phone]   Fax:  [phone]  Name: ANESSIA OAKLAND MRN: [id number] Date of Birth: 06/05/1985

## 2018-07-23 ENCOUNTER — Ambulatory Visit: Payer: PRIVATE HEALTH INSURANCE

## 2018-07-23 ENCOUNTER — Other Ambulatory Visit: Payer: Self-pay

## 2018-07-23 DIAGNOSIS — R252 Cramp and spasm: Secondary | ICD-10-CM

## 2018-07-23 DIAGNOSIS — M6281 Muscle weakness (generalized): Secondary | ICD-10-CM | POA: Diagnosis not present

## 2018-07-23 DIAGNOSIS — M5442 Lumbago with sciatica, left side: Secondary | ICD-10-CM

## 2018-07-23 NOTE — Therapy (Signed)
[facility] [address], [address] [place], [place], [address] Phone: [phone]   Fax:  [phone]  Physical Therapy Treatment  Patient Details  Name: Amy Ingram MRN: [id number] Date of Birth: 04-23-1985 Referring Provider (PT): Dr. Estill BambergMark Dumonski   Encounter Date: 07/23/2018  PT End of Session - 07/23/18 1122    Visit Number  15    Date for PT Re-Evaluation  08/28/18    Authorization Type  approved 7 more visit on 07/17/2018    Authorization - Visit Number  15    Authorization - Number of Visits  22    PT Start Time  1030    PT Stop Time  1112    PT Time Calculation (min)  42 min    Activity Tolerance  Patient tolerated treatment well;No increased pain    Behavior During Therapy  WFL for tasks assessed/performed       Past Medical History:  Diagnosis Date  . Allergy     Past Surgical History:  Procedure Laterality Date  . DENTAL SURGERY  2006   dental implants 2006  . SACROILIAC JOINT FUSION Left 01/18/2018   Procedure: LEFT SACROILIAC JOINT FUSION;  Surgeon: Estill Bambergumonski, Mark, MD;  Location: [facility] OR;  Service: Orthopedics;  Laterality: Left;    There were no vitals filed for this visit.  Subjective Assessment - 07/23/18 1037    Subjective  I will be doing work conditioning begining in August.    Currently in Pain?  Yes    Pain Score  4     Pain Location  Leg    Pain Orientation  Left    Pain Descriptors / Indicators  Sharp    Pain Type  Chronic pain    Pain Onset  More than a month ago    Pain Frequency  Intermittent    Aggravating Factors   activity, standing    Pain Relieving Factors  ice, heat, stretching                       [facility] Adult PT Treatment/Exercise - 07/23/18 0001      Lumbar Exercises: Stretches   Active Hamstring Stretch  Right;Left;1 rep;30 seconds   sitting   Lower Trunk Rotation  4 reps;20 seconds   supine rocking knees   Piriformis Stretch  Right;Left;1 rep;30  seconds   supine     Lumbar Exercises: Aerobic   Nustep  level 2 for 6 minutes; seat#7; no arms      Lumbar Exercises: Machines for Strengthening   Leg Press  both legs 55#  2 x 10 working on control; SEat #7;  left leg 10# 10x      Lumbar Exercises: Supine   Ab Set  10 reps;5 seconds    AB Set Limitations  ball squeeze betwen knees with TA activation    Bent Knee Raise  20 reps;1 second    Dead Bug  20 reps;1 second    Bridge  10 reps    Bridge Limitations  lift one vertebrae at a time within painfree range      Knee/Hip Exercises: Standing   Lateral Step Up  Left;1 set;15 reps;Hand Hold: 1;Step Height: 6"    Forward Step Up  Left;1 set;15 reps;Hand Hold: 1;Step Height: 6"   no pain   Rebounder  weight shifting 3 ways x1 minute each               PT Short Term  Goals - 07/03/18 1455      PT SHORT TERM GOAL #1   Title  independent with initial HEP    Time  3    Period  Weeks    Status  Achieved    Target Date  03/22/18      PT SHORT TERM GOAL #2   Title  ability to ambulate with heel toe gait on the left with no tingling in left leg    Baseline  able to get left heel down with 95% less tingling and not constant; intermittently    Time  3    Period  Weeks    Status  Achieved    Target Date  03/22/18      PT SHORT TERM GOAL #3   Title  ability to perform transitional movements with pain level decreased >/= 25% due to engaging the core and pelvic floor    Time  3    Period  Weeks    Status  Achieved    Target Date  03/22/18        PT Long Term Goals - 07/23/18 1039      PT LONG TERM GOAL #2   Title  ability to sit with equal weight on bilateral buttocks due to pain decreased >/= 75%    Baseline  still shifting to the Rt- "shooting" into Lt LE with sitting    Time  6    Period  Weeks    Status  On-going            Plan - 07/23/18 1105    Clinical Impression Statement  Pt remains weak in Lt LE, hips and core.  Pt reported some weakness and  soreness with exercise in the clinic today.  Pt reported fatigue with strength exercises in the clinic and experienced pain with bridging and single leg press on the Lt.  PT provided verbal and tactile cues for alignment and to improve stabilization.  Pt will continue to benefit from skilled PT for strength, stability and flexibility to improve ability to return to regular work tasks.    Rehab Potential  Excellent    Clinical Impairments Affecting Rehab Potential  Left SI joint fusion 01/18/2018 with 10# lifting limit    PT Frequency  2x / week    PT Duration  8 weeks    PT Treatment/Interventions  Cryotherapy;Electrical Stimulation;Iontophoresis 4mg /ml Dexamethasone;Ultrasound;Manual techniques;Gait training;Therapeutic activities;Therapeutic exercise;Neuromuscular re-education;Balance training;Patient/family education;Passive range of motion;Dry needling;Scar mobilization;Taping;Manual lymph drainage    PT Next Visit Plan  continue with closed chain strength for left leg; nustep; squats;  soft tissue work/DN if needed and neural tension stretch; work on squatting  without weight to prepare for lifting, work on weight shifting    PT Home Exercise Plan  Access Code: [email or web address]     Consulted and Agree with Plan of Care  Patient       Patient will benefit from skilled therapeutic intervention in order to improve the following deficits and impairments:  Abnormal gait, Increased fascial restricitons, Pain, Decreased coordination, Decreased mobility, Increased muscle spasms, Postural dysfunction, Decreased activity tolerance, Decreased endurance, Decreased range of motion, Decreased strength, Difficulty walking  Visit Diagnosis: 1. Cramp and spasm   2. Acute left-sided low back pain with left-sided sciatica   3. Muscle weakness (generalized)        Problem List There are no active problems to display for this patient.  Lorrene ReidKelly Blossie Raffel, PT 07/23/18 11:23 AM  [facility] [address], [place], [place], [address] Phone: [phone]   Fax:  [phone]  Name: Amy Ingram MRN: [id number] Date of Birth: April 30, 1985

## 2018-07-27 ENCOUNTER — Other Ambulatory Visit: Payer: Self-pay

## 2018-07-27 ENCOUNTER — Ambulatory Visit: Payer: PRIVATE HEALTH INSURANCE | Admitting: Physical Therapy

## 2018-07-27 ENCOUNTER — Encounter: Payer: Self-pay | Admitting: Physical Therapy

## 2018-07-27 DIAGNOSIS — M6281 Muscle weakness (generalized): Secondary | ICD-10-CM | POA: Diagnosis not present

## 2018-07-27 DIAGNOSIS — M5442 Lumbago with sciatica, left side: Secondary | ICD-10-CM

## 2018-07-27 NOTE — Therapy (Signed)
[facility] [address], [address] [place], [place], [address] Phone: [phone]   Fax:  [phone]  Physical Therapy Treatment  Patient Details  Name: Amy HiddenHeather R Ingram MRN: [id number] Date of Birth: 25-Aug-1985 Referring Provider (PT): Dr. Estill BambergMark Dumonski   Encounter Date: 07/27/2018  PT End of Session - 07/27/18 1041    Visit Number  16    Date for PT Re-Evaluation  08/28/18    Authorization Type  approved 7 more visit on 07/17/2018    Authorization - Visit Number  16    Authorization - Number of Visits  22    PT Start Time  1002    PT Stop Time  1042    PT Time Calculation (min)  40 min    Activity Tolerance  Patient tolerated treatment well;No increased pain    Behavior During Therapy  WFL for tasks assessed/performed       Past Medical History:  Diagnosis Date  . Allergy     Past Surgical History:  Procedure Laterality Date  . DENTAL SURGERY  2006   dental implants 2006  . SACROILIAC JOINT FUSION Left 01/18/2018   Procedure: LEFT SACROILIAC JOINT FUSION;  Surgeon: Estill Bambergumonski, Mark, MD;  Location: [facility] OR;  Service: Orthopedics;  Laterality: Left;    There were no vitals filed for this visit.  Subjective Assessment - 07/27/18 1003    Subjective  My leg is giving me a fit today - I think it's the rain.    Patient Stated Goals  strengthening, improve ROM, get back to normal    Currently in Pain?  Yes    Pain Score  6     Pain Location  Leg    Pain Orientation  Left    Pain Descriptors / Indicators  Sharp    Pain Type  Chronic pain    Pain Radiating Towards  to Lt knee    Pain Onset  More than a month ago    Pain Frequency  Intermittent    Aggravating Factors   activity, standing    Pain Relieving Factors  ice, heat, stretching    Pain Score  6    Pain Location  Hip    Pain Descriptors / Indicators  Aching    Pain Type  Chronic pain    Pain Onset  More than a month ago    Pain Frequency  Constant    Aggravating Factors   being upright    Pain Relieving Factors  laying down                       [facility] Adult PT Treatment/Exercise - 07/27/18 0001      Exercises   Exercises  Lumbar;Knee/Hip      Lumbar Exercises: Stretches   Active Hamstring Stretch  Right;Left;30 seconds;3 reps   sitting   Single Knee to Chest Stretch  Left;3 reps;20 seconds    Lower Trunk Rotation  4 reps;20 seconds   supine rocking knees   Piriformis Stretch  Right;Left;1 rep;30 seconds   supine     Lumbar Exercises: Aerobic   Nustep  L2 x 6', legs only, seat 7, PT present to monitor symptoms      Lumbar Exercises: Standing   Other Standing Lumbar Exercises  15# cable pulleys hands to hips shoulder extension with TrA cueing, PT provided TC for alignment of trunk/pelvis      Lumbar Exercises: Supine   AB Set Limitations  ball  squeeze betwen knees with TA activation   10 reps, hold x 2 breath cycles each   Bent Knee Raise  20 reps;1 second    Dead Bug  20 reps;1 second    Bridge  10 reps    Bridge Limitations  lift one vertebrae at a time within painfree range      Knee/Hip Exercises: Standing   Lateral Step Up  Left;1 set;Hand Hold: 1;Step Height: 6";10 reps    Lateral Step Up Limitations  PT gave TC for knee control during lowering to prevent valgus angle    Forward Step Up  Left;1 set;Hand Hold: 1;Step Height: 6";10 reps   no pain   Rebounder  weight shifting 3 ways x1 minute each      Knee/Hip Exercises: Seated   Sit to Sand  10 reps;without UE support   from elevated mat table for mini squat with hip hinge              PT Short Term Goals - 07/03/18 1455      PT SHORT TERM GOAL #1   Title  independent with initial HEP    Time  3    Period  Weeks    Status  Achieved    Target Date  03/22/18      PT SHORT TERM GOAL #2   Title  ability to ambulate with heel toe gait on the left with no tingling in left leg    Baseline  able to get left heel down with 95% less  tingling and not constant; intermittently    Time  3    Period  Weeks    Status  Achieved    Target Date  03/22/18      PT SHORT TERM GOAL #3   Title  ability to perform transitional movements with pain level decreased >/= 25% due to engaging the core and pelvic floor    Time  3    Period  Weeks    Status  Achieved    Target Date  03/22/18        PT Long Term Goals - 07/23/18 1039      PT LONG TERM GOAL #2   Title  ability to sit with equal weight on bilateral buttocks due to pain decreased >/= 75%    Baseline  still shifting to the Rt- "shooting" into Lt LE with sitting    Time  6    Period  Weeks    Status  On-going            Plan - 07/27/18 1041    Clinical Impression Statement  Pt arrived with heightened Lt hip pain today which she thought might be due to weather.  Pt displays improving TrA recruitment in supine and standing but continues to be weak and need concentration when combining multiple muslce groups with need for core.  PT provided cueing for knee valgus control on Lt during step downs in closed chain and provided TC for hip abd at knees during sit to stand from elevated mat table.  pt will continue to benefit from skilled PT for strength, stability and flexibility to work toward return to work tasks with less pain.    Clinical Impairments Affecting Rehab Potential  Left SI joint fusion 01/18/2018 with 10# lifting limit    PT Frequency  2x / week    PT Duration  8 weeks    PT Treatment/Interventions  Cryotherapy;Electrical Stimulation;Iontophoresis 4mg /ml Dexamethasone;Ultrasound;Manual techniques;Gait training;Therapeutic activities;Therapeutic exercise;Neuromuscular re-education;Balance  training;Patient/family education;Passive range of motion;Dry needling;Scar mobilization;Taping;Manual lymph drainage    PT Next Visit Plan  continue with closed chain strength for left leg; nustep; squats;  soft tissue work/DN if needed and neural tension stretch; work on  squatting  without weight to prepare for lifting, work on weight shifting    PT Home Exercise Plan  Access Code: [email or web address]        Patient will benefit from skilled therapeutic intervention in order to improve the following deficits and impairments:     Visit Diagnosis: 1. Acute left-sided low back pain with left-sided sciatica   2. Muscle weakness (generalized)        Problem List There are no active problems to display for this patient.   Loistine SimasJohanna Eriverto Byrnes, PT 07/27/18 10:45 AM   [facility] [address], [address] [place], [place], [address] Phone: [phone]   Fax:  [phone]  Name: Amy HiddenHeather R Ingram MRN: [id number] Date of Birth: 21-May-1985

## 2018-07-31 ENCOUNTER — Ambulatory Visit: Payer: PRIVATE HEALTH INSURANCE

## 2018-07-31 ENCOUNTER — Other Ambulatory Visit: Payer: Self-pay

## 2018-07-31 DIAGNOSIS — M5442 Lumbago with sciatica, left side: Secondary | ICD-10-CM

## 2018-07-31 DIAGNOSIS — M6281 Muscle weakness (generalized): Secondary | ICD-10-CM | POA: Diagnosis not present

## 2018-07-31 DIAGNOSIS — R252 Cramp and spasm: Secondary | ICD-10-CM

## 2018-07-31 NOTE — Therapy (Signed)
[facility] [address], [address] [place], [place], [address] Phone: [phone]   Fax:  [phone]  Physical Therapy Treatment  Patient Details  Name: Amy Ingram MRN: [id number] Date of Birth: [birth year] Referring Provider (PT): Dr. Estill BambergMark Dumonski   Encounter Date: 07/31/2018  PT End of Session - 07/31/18 1012    Visit Number  17    Date for PT Re-Evaluation  08/28/18    Authorization Type  approved 7 more visit on 07/17/2018    Authorization - Visit Number  17    Authorization - Number of Visits  22    PT Start Time  0930    PT Stop Time  1012    PT Time Calculation (min)  42 min    Activity Tolerance  Patient tolerated treatment well;No increased pain    Behavior During Therapy  WFL for tasks assessed/performed       Past Medical History:  Diagnosis Date  . Allergy     Past Surgical History:  Procedure Laterality Date  . DENTAL SURGERY  2006   dental implants 2006  . SACROILIAC JOINT FUSION Left 01/18/2018   Procedure: LEFT SACROILIAC JOINT FUSION;  Surgeon: Estill Bambergumonski, Mark, MD;  Location: [facility] OR;  Service: Orthopedics;  Laterality: Left;    There were no vitals filed for this visit.  Subjective Assessment - 07/31/18 0934    Subjective  I'm doing ok.    Currently in Pain?  Yes    Pain Score  5     Pain Location  Leg    Pain Orientation  Left    Pain Descriptors / Indicators  Sharp    Pain Frequency  Intermittent    Aggravating Factors   activity, standing    Pain Relieving Factors  ice, heat, stretching                       [facility] Adult PT Treatment/Exercise - 07/31/18 0001      Lumbar Exercises: Stretches   Active Hamstring Stretch  Right;Left;30 seconds;3 reps   sitting   Lower Trunk Rotation  4 reps;20 seconds   supine rocking knees- ball under legs   Piriformis Stretch  Right;Left;1 rep;30 seconds   supine     Lumbar Exercises: Aerobic   Nustep  L2 x 8', legs only, seat 7,  PT present to monitor symptoms      Lumbar Exercises: Machines for Strengthening   Leg Press  both legs 50#  2 x 10 working on control; SEat #7;  left leg 10# 10x      Lumbar Exercises: Standing   Other Standing Lumbar Exercises  15# cable pulleys hands to hips shoulder extension with TrA cueing, PT provided TC for alignment of trunk/pelvis. 2x5 reps      Lumbar Exercises: Supine   AB Set Limitations  ball squeeze betwen knees with TA activation   10 reps, hold x 2 breath cycles each   Bridge  10 reps   partial ROM and painful   Bridge Limitations  lift one vertebrae at a time within painfree range      Knee/Hip Exercises: Standing   Lateral Step Up  Left;1 set;Hand Hold: 1;Step Height: 6";10 reps    Lateral Step Up Limitations  PT gave TC for knee control during lowering to prevent valgus angle      Knee/Hip Exercises: Seated   Sit to Sand  without UE support;15 reps   regular mat height  today              PT Short Term Goals - 07/03/18 1455      PT SHORT TERM GOAL #1   Title  independent with initial HEP    Time  3    Period  Weeks    Status  Achieved    Target Date  03/22/18      PT SHORT TERM GOAL #2   Title  ability to ambulate with heel toe gait on the left with no tingling in left leg    Baseline  able to get left heel down with 95% less tingling and not constant; intermittently    Time  3    Period  Weeks    Status  Achieved    Target Date  03/22/18      PT SHORT TERM GOAL #3   Title  ability to perform transitional movements with pain level decreased >/= 25% due to engaging the core and pelvic floor    Time  3    Period  Weeks    Status  Achieved    Target Date  03/22/18        PT Long Term Goals - 07/23/18 1039      PT LONG TERM GOAL #2   Title  ability to sit with equal weight on bilateral buttocks due to pain decreased >/= 75%    Baseline  still shifting to the Rt- "shooting" into Lt LE with sitting    Time  6    Period  Weeks    Status   On-going            Plan - 07/31/18 9381    Clinical Impression Statement  Pt with increased Lt hip pain today and requested to reduce weight on the leg press. Pt tolerated activity without limitation today and was able to tolerate increased time on the NuStep.  Pt demonstrates Lt hip instability with single limb activity and weightshifting on the mini tramp.  Pt continues to require cueing when recruiting multiple muscle groups in standing.  Pt requires tactile cues for knee alignment with step downs.  Pt will attend 1 more session and will D/C as she will begin work conditioning at another facility next week.    Clinical Impairments Affecting Rehab Potential  Left SI joint fusion 01/18/2018 with 10# lifting limit    PT Treatment/Interventions  Cryotherapy;Electrical Stimulation;Iontophoresis 4mg /ml Dexamethasone;Ultrasound;Manual techniques;Gait training;Therapeutic activities;Therapeutic exercise;Neuromuscular re-education;Balance training;Patient/family education;Passive range of motion;Dry needling;Scar mobilization;Taping;Manual lymph drainage    PT Next Visit Plan  1 more session with D/C to HEP    PT Home Exercise Plan  Access Code: [email or web address]     Consulted and Agree with Plan of Care  Patient       Patient will benefit from skilled therapeutic intervention in order to improve the following deficits and impairments:  Abnormal gait, Increased fascial restricitons, Pain, Decreased coordination, Decreased mobility, Increased muscle spasms, Postural dysfunction, Decreased activity tolerance, Decreased endurance, Decreased range of motion, Decreased strength, Difficulty walking  Visit Diagnosis: 1. Acute left-sided low back pain with left-sided sciatica   2. Muscle weakness (generalized)   3. Cramp and spasm        Problem List There are no active problems to display for this patient.   Sigurd Sos, PT 07/31/18 10:13 AM  [facility] [address], [place], [place], [address] Phone: [phone]   Fax:  [phone]  Name: Amy Ingram  Amy Ingram MRN: [id number] Date of Birth: February 17, 1985

## 2018-08-02 ENCOUNTER — Ambulatory Visit: Payer: PRIVATE HEALTH INSURANCE

## 2018-08-02 ENCOUNTER — Other Ambulatory Visit: Payer: Self-pay

## 2018-08-02 DIAGNOSIS — M6281 Muscle weakness (generalized): Secondary | ICD-10-CM | POA: Diagnosis not present

## 2018-08-02 DIAGNOSIS — M5442 Lumbago with sciatica, left side: Secondary | ICD-10-CM

## 2018-08-02 DIAGNOSIS — R252 Cramp and spasm: Secondary | ICD-10-CM

## 2018-08-02 NOTE — Therapy (Signed)
[facility] [address] [place], [place], [address] Phone: [phone]   Fax:  [phone]  Physical Therapy Treatment  Patient Details  Name: Amy Ingram MRN: [id number] Date of Birth: [birth year] Referring Provider (PT): Dr. Phylliss Bob   Encounter Date: 08/02/2018  PT End of Session - 08/02/18 1004    Visit Number  18    Date for PT Re-Evaluation  08/28/18    Authorization - Visit Number  18    Authorization - Number of Visits  22    PT Start Time  0932    PT Stop Time  1007    PT Time Calculation (min)  35 min       Past Medical History:  Diagnosis Date  . Allergy     Past Surgical History:  Procedure Laterality Date  . DENTAL SURGERY  2006   dental implants 2006  . SACROILIAC JOINT FUSION Left 01/18/2018   Procedure: LEFT SACROILIAC JOINT FUSION;  Surgeon: Phylliss Bob, MD;  Location: [place];  Service: Orthopedics;  Laterality: Left;    There were no vitals filed for this visit.  Subjective Assessment - 08/02/18 0932    Subjective  I was sore after last session.  I start work conditioning on Monday.    Currently in Pain?  Yes    Pain Score  4     Pain Location  Leg    Pain Orientation  Left    Pain Descriptors / Indicators  Sharp    Pain Type  Chronic pain    Pain Onset  More than a month ago    Pain Frequency  Intermittent    Aggravating Factors   activity, standing, rolling in bed    Pain Relieving Factors  ice, heat, stretching         [facility] PT Assessment - 08/02/18 0001      Assessment   Medical Diagnosis  s/p left SI joint fusion    Referring Provider (PT)  Dr. 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      Cognition   Overall Cognitive Status  Within Functional Limits for tasks assessed      Strength   Left Hip Flexion  4-/5    Left Hip Extension  4-/5    Left Hip ABduction  4/5                   [facility] Adult PT Treatment/Exercise - 08/02/18 0001      Lumbar Exercises: Stretches   Active Hamstring Stretch  Right;Left;30 seconds;3 reps   sitting   Single Knee to Chest Stretch  Left;3 reps;20 seconds    Lower Trunk Rotation  4 reps;20 seconds   supine rocking knees- ball under legs   Piriformis Stretch  Right;Left;1 rep;30 seconds   supine     Lumbar Exercises: Aerobic   Nustep  L2 x 8', legs only, seat 7, PT present to monitor symptoms      Lumbar Exercises: Machines for Strengthening   Leg Press  both  legs 50#  2 x 10 working on control; SEat #7;  left leg 10# 10x      Lumbar Exercises: Supine   Bridge  10 reps   partial ROM and painful   Bridge Limitations  lift one vertebrae at a time within painfree range      Knee/Hip Exercises: Standing   Lateral Step Up  Left;1 set;Hand Hold: 1;Step Height: 6";10 reps    Lateral Step Up Limitations  PT gave TC for knee control during lowering to prevent valgus angle      Knee/Hip Exercises: Seated   Sit to Sand  without UE support;15 reps   regular mat height today              PT Short Term Goals - 07/03/18 1455      PT SHORT TERM GOAL #1   Title  independent with initial HEP    Time  3    Period  Weeks    Status  Achieved    Target Date  03/22/18      PT SHORT TERM GOAL #2   Title  ability to ambulate with heel toe gait on the left with no tingling in left leg    Baseline  able to get left heel down with 95% less tingling and not constant; intermittently    Time  3    Period  Weeks    Status  Achieved    Target Date  03/22/18      PT SHORT TERM GOAL #3   Title  ability to perform transitional movements with pain level decreased >/= 25% due to  engaging the core and pelvic floor    Time  3    Period  Weeks    Status  Achieved    Target Date  03/22/18        PT Long Term Goals - 08/02/18 0934      PT LONG TERM GOAL #1   Title  independent with initial HEP    Status  Achieved      PT LONG TERM GOAL #2   Title  ability to sit with equal weight on bilateral buttocks due to pain decreased >/= 75%    Baseline  50% of the time able to sit Rt=Lt    Status  Partially Met      PT LONG TERM GOAL #3   Title  able to walk in the community with minimal to no gait deficits and pain decreased >/= 75% and full weight on left leg    Baseline  40% improvement.  Symmetry with ambulation    Status  Partially Met      PT LONG TERM GOAL #4   Title  left hip strength 4+/5 so patient is able to go up and down stairs with step over step pattern    Status  Partially Met      PT LONG TERM GOAL #5   Title  ability to squat and lift within her restrictions from the doctor with correct body mechanics and due to increased hip strength >/= 4+/5    Baseline  able to do this but painful    Status  Partially Met            Plan - 08/02/18 1001    Clinical Impression Statement  Pt will discharge today as she will begin work conditioning at another facility.  Pt with reduced Lt hip strength throughout.  Pt demonstrated increased challenge with strength exercises today.  Pt reports 40% overall pain reduction in the Lt hip and leg since the start of care.  Pt is able to negotiate steps with step-over- step gait and demonstrates symmetry with ambulation.   Pt will continue with HEP and transition to work conditioning.    PT Next Visit Plan  D/C PT    PT Home Exercise Plan  Access Code: [email or web address]     Consulted and Agree with Plan of Care  Patient       Patient will benefit from skilled therapeutic intervention in order to improve the following deficits and impairments:     Visit Diagnosis: 1. Acute left-sided low back pain with left-sided sciatica    2. Muscle weakness (generalized)   3. Cramp and spasm        Problem List There are no active problems to display for this patient.  PHYSICAL THERAPY DISCHARGE SUMMARY  Visits from Start of Care: 18  Current functional level related to goals / functional outcomes: Pt attended 18 sessions s/p SI joint fusion and will D/C to transition to a work conditioning program at another facility.     Remaining deficits: Lt LE pain, weakness and limited function.     Education / Equipment: HEP Plan: Patient agrees to discharge.  Patient goals were partially met. Patient is being discharged due to the physician's request.  ?????         Sigurd Sos, PT 08/02/18 10:15 AM  [facility] [address] [place], [place], [address] Phone: [phone]   Fax:  [phone]  Name: Amy Ingram MRN: [id number] Date of Birth: 01/17/1985

## 2019-03-28 ENCOUNTER — Encounter: Payer: Self-pay | Admitting: Nurse Practitioner

## 2019-04-09 ENCOUNTER — Encounter: Payer: Self-pay | Admitting: Nurse Practitioner

## 2019-04-24 ENCOUNTER — Other Ambulatory Visit: Payer: Self-pay

## 2019-04-24 ENCOUNTER — Encounter: Payer: Self-pay | Admitting: Nurse Practitioner

## 2019-04-24 ENCOUNTER — Ambulatory Visit (INDEPENDENT_AMBULATORY_CARE_PROVIDER_SITE_OTHER): Payer: 59 | Admitting: Nurse Practitioner

## 2019-04-24 VITALS — BP 112/84 | HR 84 | Temp 97.4°F | Ht 65.0 in | Wt 112.0 lb

## 2019-04-24 DIAGNOSIS — R590 Localized enlarged lymph nodes: Secondary | ICD-10-CM | POA: Diagnosis not present

## 2019-04-24 DIAGNOSIS — Z0001 Encounter for general adult medical examination with abnormal findings: Secondary | ICD-10-CM | POA: Diagnosis not present

## 2019-04-24 DIAGNOSIS — Z0000 Encounter for general adult medical examination without abnormal findings: Secondary | ICD-10-CM | POA: Insufficient documentation

## 2019-04-24 DIAGNOSIS — R87610 Atypical squamous cells of undetermined significance on cytologic smear of cervix (ASC-US): Secondary | ICD-10-CM | POA: Diagnosis not present

## 2019-04-24 LAB — URINALYSIS, COMPLETE
Bilirubin, UA: NEGATIVE
Glucose, UA: NEGATIVE
Ketones, UA: NEGATIVE
Leukocytes,UA: NEGATIVE
Nitrite, UA: NEGATIVE
Protein,UA: NEGATIVE
RBC, UA: NEGATIVE
Specific Gravity, UA: 1.025 (ref 1.005–1.030)
Urobilinogen, Ur: 0.2 mg/dL (ref 0.2–1.0)
pH, UA: 5 (ref 5.0–7.5)

## 2019-04-24 LAB — MICROSCOPIC EXAMINATION
Bacteria, UA: NONE SEEN
RBC, Urine: NONE SEEN /hpf (ref 0–2)
Renal Epithel, UA: NONE SEEN /hpf

## 2019-04-24 MED ORDER — AMOXICILLIN 875 MG PO TABS: 875.0000 mg | ORAL_TABLET | Freq: Two times a day (BID) | ORAL | 0 refills | Status: DC

## 2019-04-24 MED ORDER — CYCLOBENZAPRINE HCL 5 MG PO TABS: 5.0000 mg | ORAL_TABLET | Freq: Three times a day (TID) | ORAL | 3 refills | Status: DC | PRN

## 2019-04-24 NOTE — Patient Instructions (Signed)
Stress, Adult Stress is a normal reaction to life events. Stress is what you feel when life demands more than you are used to, or more than you think you can handle. Some stress can be useful, such as studying for a test or meeting a deadline at work. Stress that occurs too often or for too long can cause problems. It can affect your emotional health and interfere with relationships and normal daily activities. Too much stress can weaken your body's defense system (immune system) and increase your risk for physical illness. If you already have a medical problem, stress can make it worse. What are the causes? All sorts of life events can cause stress. An event that causes stress for one person may not be stressful for another person. Major life events, whether positive or negative, commonly cause stress. Examples include:  Losing a job or starting a new job.  Losing a loved one.  Moving to a new town or home.  Getting married or divorced.  Having a baby.  Getting injured or sick. Less obvious life events can also cause stress, especially if they occur day after day or in combination with each other. Examples include:  Working long hours.  Driving in traffic.  Caring for children.  Being in debt.  Being in a difficult relationship. What are the signs or symptoms? Stress can cause emotional symptoms, including:  Anxiety. This is feeling worried, afraid, on edge, overwhelmed, or out of control.  Anger, including irritation or impatience.  Depression. This is feeling sad, down, helpless, or guilty.  Trouble focusing, remembering, or making decisions. Stress can cause physical symptoms, including:  Aches and pains. These may affect your head, neck, back, stomach, or other areas of your body.  Tight muscles or a clenched jaw.  Low energy.  Trouble sleeping. Stress can cause unhealthy behaviors, including:  Eating to feel better (overeating) or skipping meals.  Working too  much or putting off tasks.  Smoking, drinking alcohol, or using drugs to feel better. How is this diagnosed? Stress is diagnosed through an assessment by your health care provider. He or she may diagnose this condition based on:  Your symptoms and any stressful life events.  Your medical history.  Tests to rule out other causes of your symptoms. Depending on your condition, your health care provider may refer you to a specialist for further evaluation. How is this treated?  Stress management techniques are the recommended treatment for stress. Medicine is not typically recommended for the treatment of stress. Techniques to reduce your reaction to stressful life events include:  Stress identification. Monitor yourself for symptoms of stress and identify what causes stress for you. These skills may help you to avoid or prepare for stressful events.  Time management. Set your priorities, keep a calendar of events, and learn to say no. Taking these actions can help you avoid making too many commitments. Techniques for coping with stress include:  Rethinking the problem. Try to think realistically about stressful events rather than ignoring them or overreacting. Try to find the positives in a stressful situation rather than focusing on the negatives.  Exercise. Physical exercise can release both physical and emotional tension. The key is to find a form of exercise that you enjoy and do it regularly.  Relaxation techniques. These relax the body and mind. The key is to find one or more that you enjoy and use the techniques regularly. Examples include: ? Meditation, deep breathing, or progressive relaxation techniques. ? Yoga or   tai chi. ? Biofeedback, mindfulness techniques, or journaling. ? Listening to music, being out in nature, or participating in other hobbies.  Practicing a healthy lifestyle. Eat a balanced diet, drink plenty of water, limit or avoid caffeine, and get plenty of sleep.   Having a strong support network. Spend time with family, friends, or other people you enjoy being around. Express your feelings and talk things over with someone you trust. Counseling or talk therapy with a mental health professional may be helpful if you are having trouble managing stress on your own. Follow these instructions at home: Lifestyle   Avoid drugs.  Do not use any products that contain nicotine or tobacco, such as cigarettes, e-cigarettes, and chewing tobacco. If you need help quitting, ask your health care provider.  Limit alcohol intake to no more than 1 drink a day for nonpregnant women and 2 drinks a day for men. One drink equals 12 oz of beer, 5 oz of wine, or 1 oz of hard liquor  Do not use alcohol or drugs to relax.  Eat a balanced diet that includes fresh fruits and vegetables, whole grains, lean meats, fish, eggs, and beans, and low-fat dairy. Avoid processed foods and foods high in added fat, sugar, and salt.  Exercise at least 30 minutes on 5 or more days each week.  Get 7-8 hours of sleep each night. General instructions   Practice stress management techniques as discussed with your health care provider.  Drink enough fluid to keep your urine clear or pale yellow.  Take over-the-counter and prescription medicines only as told by your health care provider.  Keep all follow-up visits as told by your health care provider. This is important. Contact a health care provider if:  Your symptoms get worse.  You have new symptoms.  You feel overwhelmed by your problems and can no longer manage them on your own. Get help right away if:  You have thoughts of hurting yourself or others. If you ever feel like you may hurt yourself or others, or have thoughts about taking your own life, get help right away. You can go to your nearest emergency department or call:  Your local emergency services (911 in the [place]  A suicide crisis helpline, such as the National  Suicide Prevention Lifeline at 1-800-273-8255. This is open 24 hours a day. Summary  Stress is a normal reaction to life events. It can cause problems if it happens too often or for too long.  Practicing stress management techniques is the best way to treat stress.  Counseling or talk therapy with a mental health professional may be helpful if you are having trouble managing stress on your own. This information is not intended to replace advice given to you by your health care provider. Make sure you discuss any questions you have with your health care provider. Document Revised: 07/20/2018 Document Reviewed: 02/10/2016 Elsevier Patient Education  2020 Elsevier Inc.  

## 2019-04-24 NOTE — Progress Notes (Signed)
 Subjective:    Patient ID: Amy Ingram, female    DOB: 10/07/1985, 33 y.o.   MRN: [id number]   Chief Complaint: Annual Exam (With pap )    HPI:  1. Annual physical exam Pt is here for annual physical and pap. Had back surgery in January 2020. Takes robaxin prn. Also was taking flexeril but is almost out and would like to get a refill. Still has the meclizine but has not needed to take it. Back has been sore the last few days because of changes in season. She tries to do some stretching to help. Has been released by orthopedic doctor who gave her a 20-lb weight restriction. She would like to see about getting a referral for PT because it has helped.   Outpatient Encounter Medications as of 04/24/2019  Medication Sig  . cyclobenzaprine (FLEXERIL) 5 MG tablet Take 5 mg by mouth 3 (three) times daily as needed for muscle spasms.  . methocarbamol (ROBAXIN) 500 MG tablet Take 1 tablet (500 mg total) by mouth 2 (two) times daily. (Patient taking differently: Take 500 mg by mouth 2 (two) times daily as needed for muscle spasms. )  . meclizine (ANTIVERT) 25 MG tablet Take 1 tablet (25 mg total) by mouth 3 (three) times daily as needed for dizziness. (Patient not taking: Reported on 04/24/2019)   No facility-administered encounter medications on file as of 04/24/2019.    Past Surgical History:  Procedure Laterality Date  . DENTAL SURGERY  2006   dental implants 2006  . SACROILIAC JOINT FUSION Left 01/18/2018   Procedure: LEFT SACROILIAC JOINT FUSION;  Surgeon: Dumonski, Mark, MD;  Location: [facility];  Service: Orthopedics;  Laterality: Left;    No family history on file.  New complaints: None  Controlled substance contract: N/A     Review of Systems  Constitutional: Negative.   HENT: Negative.   Eyes: Negative.   Respiratory: Negative.   Cardiovascular: Negative.   Gastrointestinal: Negative.   Endocrine: Negative.   Genitourinary: Negative.   Musculoskeletal: Positive for back  pain.  Skin: Negative.   Neurological: Negative.   Psychiatric/Behavioral: Negative.        Objective:   Physical Exam Vitals and nursing note reviewed. Exam conducted with a chaperone present.  HENT:     Head: Normocephalic.     Right Ear: Tympanic membrane normal.     Left Ear: Tympanic membrane normal.     Nose: Nose normal.     Mouth/Throat:     Mouth: Mucous membranes are moist.     Pharynx: Oropharynx is clear.  Eyes:     Conjunctiva/sclera: Conjunctivae normal.     Pupils: Pupils are equal, round, and reactive to light.  Cardiovascular:     Rate and Rhythm: Normal rate and regular rhythm.     Pulses: Normal pulses.     Heart sounds: Normal heart sounds.  Pulmonary:     Effort: Pulmonary effort is normal.     Breath sounds: Normal breath sounds.  Chest:     Breasts:        Right: Normal.        Left: Normal.  Abdominal:     General: Bowel sounds are normal.     Palpations: Abdomen is soft.  Genitourinary:    General: Normal vulva.     Exam position: Lithotomy position.     Vagina: Normal. No vaginal discharge.     Cervix: Normal.     Uterus: Normal.        Adnexa: Right adnexa normal and left adnexa normal.     Comments: cervix nonparous and pink No adnexal masses ortenderness Musculoskeletal:        General: Normal range of motion.     Cervical back: Normal range of motion.  Lymphadenopathy:     Cervical: Cervical adenopathy present.     Right cervical: Superficial cervical adenopathy (3 cm nontender) present.  Skin:    General: Skin is warm and dry.     Capillary Refill: Capillary refill takes less than 2 seconds.  Neurological:     Mental Status: She is alert and oriented to person, place, and time.  Psychiatric:        Behavior: Behavior normal.   BP 112/84   Pulse 84   Temp (!) 97.4 F (36.3 C) (Temporal)   Ht 5' 5" (1.651 m)   Wt 112 lb (50.8 kg)   SpO2 98%   BMI 18.64 kg/m       Assessment & Plan:   Amy Ingram comes in today with  chief complaint of Annual Exam (With pap )   Diagnosis and orders addressed:  1. Annual physical exam Follow up with orthopedic surgeon for PT referral - Urinalysis, Complete - CBC with Differential/Platelet - CMP14+EGFR - Lipid panel - Thyroid Panel With TSH - IGP,CtNgTv,Apt HPV  2. Lymphadenopathy, anterior cervical - amoxicillin (AMOXIL) 875 MG tablet; Take 1 tablet (875 mg total) by mouth 2 (two) times daily. 1 po BID  Dispense: 20 tablet; Refill: 0   Labs pending Health Maintenance reviewed Diet and exercise encouraged  Follow up plan: prn   Mary-Margaret Hassell Done, FNP

## 2019-04-25 LAB — LIPID PANEL
Chol/HDL Ratio: 2 ratio (ref 0.0–4.4)
Cholesterol, Total: 151 mg/dL (ref 100–199)
HDL: 75 mg/dL (ref >39)
LDL Chol Calc (NIH): 53 mg/dL (ref 0–99)
Triglycerides: 136 mg/dL (ref 0–149)
VLDL Cholesterol Cal: 23 mg/dL (ref 5–40)

## 2019-04-25 LAB — CBC WITH DIFFERENTIAL/PLATELET
Basophils Absolute: 0 10*3/uL (ref 0.0–0.2)
Basos: 1 %
EOS (ABSOLUTE): 0.1 10*3/uL (ref 0.0–0.4)
Eos: 2 %
Hematocrit: 41.2 % (ref 34.0–46.6)
Hemoglobin: 13.4 g/dL (ref 11.1–15.9)
Immature Grans (Abs): 0 10*3/uL (ref 0.0–0.1)
Immature Granulocytes: 0 %
Lymphocytes Absolute: 1.8 10*3/uL (ref 0.7–3.1)
Lymphs: 34 %
MCH: 31.5 pg (ref 26.6–33.0)
MCHC: 32.5 g/dL (ref 31.5–35.7)
MCV: 97 fL (ref 79–97)
Monocytes Absolute: 0.4 10*3/uL (ref 0.1–0.9)
Monocytes: 8 %
Neutrophils Absolute: 2.8 10*3/uL (ref 1.4–7.0)
Neutrophils: 55 %
Platelets: 206 10*3/uL (ref 150–450)
RBC: 4.25 x10E6/uL (ref 3.77–5.28)
RDW: 13.1 % (ref 11.7–15.4)
WBC: 5.2 10*3/uL (ref 3.4–10.8)

## 2019-04-25 LAB — CMP14+EGFR
ALT: 12 IU/L (ref 0–32)
AST: 17 IU/L (ref 0–40)
Albumin/Globulin Ratio: 1.8 (ref 1.2–2.2)
Albumin: 4.6 g/dL (ref 3.8–4.8)
Alkaline Phosphatase: 62 IU/L (ref 39–117)
BUN/Creatinine Ratio: 9 (ref 9–23)
BUN: 6 mg/dL (ref 6–20)
Bilirubin Total: 0.5 mg/dL (ref 0.0–1.2)
CO2: 23 mmol/L (ref 20–29)
Calcium: 9.2 mg/dL (ref 8.7–10.2)
Chloride: 99 mmol/L (ref 96–106)
Creatinine, Ser: 0.64 mg/dL (ref 0.57–1.00)
GFR calc Af Amer: 136 mL/min/{1.73_m2} (ref >59)
GFR calc non Af Amer: 118 mL/min/{1.73_m2} (ref >59)
Globulin, Total: 2.5 g/dL (ref 1.5–4.5)
Glucose: 92 mg/dL (ref 65–99)
Potassium: 3.9 mmol/L (ref 3.5–5.2)
Sodium: 138 mmol/L (ref 134–144)
Total Protein: 7.1 g/dL (ref 6.0–8.5)

## 2019-04-25 LAB — THYROID PANEL WITH TSH
Free Thyroxine Index: 1.8 (ref 1.2–4.9)
T3 Uptake Ratio: 28 % (ref 24–39)
T4, Total: 6.5 ug/dL (ref 4.5–12.0)
TSH: 0.952 u[IU]/mL (ref 0.450–4.500)

## 2019-04-30 LAB — IGP,CTNGTV,APT HPV
Chlamydia, Nuc. Acid Amp: NEGATIVE
Gonococcus, Nuc. Acid Amp: NEGATIVE
HPV Aptima: NEGATIVE
Trich vag by NAA: NEGATIVE

## 2020-04-28 ENCOUNTER — Ambulatory Visit (INDEPENDENT_AMBULATORY_CARE_PROVIDER_SITE_OTHER): Payer: No Typology Code available for payment source | Admitting: Nurse Practitioner

## 2020-04-28 ENCOUNTER — Encounter: Payer: Self-pay | Admitting: Nurse Practitioner

## 2020-04-28 DIAGNOSIS — J0111 Acute recurrent frontal sinusitis: Secondary | ICD-10-CM

## 2020-04-28 DIAGNOSIS — R059 Cough, unspecified: Secondary | ICD-10-CM | POA: Diagnosis not present

## 2020-04-28 MED ORDER — DM-GUAIFENESIN ER 30-600 MG PO TB12: 1.0000 | ORAL_TABLET | Freq: Two times a day (BID) | ORAL | 0 refills | Status: DC

## 2020-04-28 MED ORDER — FLUTICASONE PROPIONATE 50 MCG/ACT NA SUSP: 2.0000 | Freq: Every day | NASAL | 6 refills | Status: DC

## 2020-04-28 MED ORDER — BENZONATATE 100 MG PO CAPS: 100.0000 mg | ORAL_CAPSULE | Freq: Three times a day (TID) | ORAL | 0 refills | Status: DC | PRN

## 2020-04-28 MED ORDER — AMOXICILLIN-POT CLAVULANATE 875-125 MG PO TABS: 1.0000 | ORAL_TABLET | Freq: Two times a day (BID) | ORAL | 0 refills | Status: DC

## 2020-04-28 NOTE — Assessment & Plan Note (Signed)
Guaifenesin/Mucinex for cough.  Rx sent to pharmacy

## 2020-04-28 NOTE — Progress Notes (Signed)
   Virtual Visit  Note Due to COVID-19 pandemic this visit was conducted virtually. This visit type was conducted due to national recommendations for restrictions regarding the COVID-19 Pandemic (e.g. social distancing, sheltering in place) in an effort to limit this patient's exposure and mitigate transmission in our community. All issues noted in this document were discussed and addressed.  A physical exam was not performed with this format.  I connected with Amy Ingram on 04/28/20 at  11:10 AM by telephone and verified that I am speaking with the correct person using two identifiers. Amy Ingram is currently located at home during visit. The provider, Daryll Drown, NP is located in their office at time of visit.  I discussed the limitations, risks, security and privacy concerns of performing an evaluation and management service by telephone and the availability of in person appointments. I also discussed with the patient that there may be a patient responsible charge related to this service. The patient expressed understanding and agreed to proceed.   History and Present Illness:  Sinusitis This is a recurrent problem. The current episode started in the past 7 days. There has been no fever. She is experiencing no pain. Associated symptoms include coughing and headaches. Past treatments include nothing.      Review of Systems  Constitutional: Negative.   HENT: Negative.   Respiratory: Positive for cough.   Cardiovascular: Negative.   Skin: Negative for rash.  Neurological: Positive for headaches.     Observations/Objective: Televisit patient did not sound to be in distress.  Assessment and Plan: Take medication as prescribed Cool-mist humidifier Saline nose. Increase hydration Tylenol/ibuprofen for fever   Tessalon pearls 100 mg tablet by mouth, Augmentin 875-125 mg tablet by mouth.  Guaifenesin 30-600 mg tablet, and Flonase   follow-up instructions: Patient  knows to follow-up with worsening or unresolved symptoms.    I discussed the assessment and treatment plan with the patient. The patient was provided an opportunity to ask questions and all were answered. The patient agreed with the plan and demonstrated an understanding of the instructions.   The patient was advised to call back or seek an in-person evaluation if the symptoms worsen or if the condition fails to improve as anticipated.  The above assessment and management plan was discussed with the patient. The patient verbalized understanding of and has agreed to the management plan. Patient is aware to call the clinic if symptoms persist or worsen. Patient is aware when to return to the clinic for a follow-up visit. Patient educated on when it is appropriate to go to the emergency department.   Time call ended: 11:19 AM  I provided 9 minutes of  non face-to-face time during this encounter.    Daryll Drown, NP

## 2020-04-28 NOTE — Patient Instructions (Signed)

## 2020-04-28 NOTE — Assessment & Plan Note (Signed)
Take medication as prescribed Cool-mist humidifier Saline nose. Increase hydration Tylenol/ibuprofen for fever

## 2020-07-09 ENCOUNTER — Encounter: Payer: No Typology Code available for payment source | Admitting: Nurse Practitioner

## 2020-07-13 ENCOUNTER — Encounter: Payer: No Typology Code available for payment source | Admitting: Nurse Practitioner

## 2020-07-20 ENCOUNTER — Other Ambulatory Visit (HOSPITAL_COMMUNITY)
Admission: RE | Admit: 2020-07-20 | Discharge: 2020-07-20 | Disposition: A | Discharge status: Home / Self Care | Payer: Self-pay | Source: Ambulatory Visit | Attending: Nurse Practitioner | Admitting: Nurse Practitioner

## 2020-07-20 ENCOUNTER — Ambulatory Visit (INDEPENDENT_AMBULATORY_CARE_PROVIDER_SITE_OTHER): Payer: No Typology Code available for payment source | Admitting: Nurse Practitioner

## 2020-07-20 ENCOUNTER — Encounter: Payer: Self-pay | Admitting: Nurse Practitioner

## 2020-07-20 ENCOUNTER — Other Ambulatory Visit: Payer: Self-pay

## 2020-07-20 VITALS — BP 110/82 | HR 79 | Temp 97.9°F | Resp 20 | Ht 65.0 in | Wt 110.0 lb

## 2020-07-20 DIAGNOSIS — Z0000 Encounter for general adult medical examination without abnormal findings: Secondary | ICD-10-CM | POA: Insufficient documentation

## 2020-07-20 MED ORDER — CYCLOBENZAPRINE HCL 5 MG PO TABS: 5.0000 mg | ORAL_TABLET | Freq: Three times a day (TID) | ORAL | 3 refills | Status: DC | PRN

## 2020-07-20 NOTE — Progress Notes (Signed)
Subjective:    Patient ID: Amy Ingram, female    DOB: [birth year], 35 y.o.   MRN: [id number]  Chief Complaint: Annual Exam   HPI Patient comes in today for annual physical exam. Her last PAP was done 04/24/19 with results of ASCUS. We will repeat it today. She says sh eis doing well with no complaints.  Needs refill of flexeril that she uses occasionally for back pain from work.  Review of Systems  Constitutional:  Negative for diaphoresis.  Eyes:  Negative for pain.  Respiratory:  Negative for shortness of breath.   Cardiovascular:  Negative for chest pain, palpitations and leg swelling.  Gastrointestinal:  Negative for abdominal pain.  Endocrine: Negative for polydipsia.  Skin:  Negative for rash.  Neurological:  Negative for dizziness, weakness and headaches.  Hematological:  Does not bruise/bleed easily.  All other systems reviewed and are negative.     Objective:   Physical Exam Vitals and nursing note reviewed.  Constitutional:      General: She is not in acute distress.    Appearance: Normal appearance. She is well-developed.  HENT:     Head: Normocephalic.     Right Ear: Tympanic membrane normal.     Left Ear: Tympanic membrane normal.     Nose: Nose normal.     Mouth/Throat:     Mouth: Mucous membranes are moist.  Eyes:     Pupils: Pupils are equal, round, and reactive to light.  Neck:     Vascular: No carotid bruit or JVD.  Cardiovascular:     Rate and Rhythm: Normal rate and regular rhythm.     Heart sounds: Normal heart sounds.  Pulmonary:     Effort: Pulmonary effort is normal. No respiratory distress.     Breath sounds: Normal breath sounds. No wheezing or rales.  Chest:     Chest wall: No tenderness.  Abdominal:     General: Bowel sounds are normal. There is no distension or abdominal bruit.     Palpations: Abdomen is soft. There is no hepatomegaly, splenomegaly, mass or pulsatile mass.     Tenderness: There is no abdominal tenderness.   Genitourinary:    General: Normal vulva.     Vagina: No vaginal discharge.     Rectum: Normal.     Comments: cervix nonparous and pink No adnexal masses or tenderness. Musculoskeletal:        General: Normal range of motion.     Cervical back: Normal range of motion and neck supple.  Lymphadenopathy:     Cervical: No cervical adenopathy.  Skin:    General: Skin is warm and dry.  Neurological:     Mental Status: She is alert and oriented to person, place, and time.     Deep Tendon Reflexes: Reflexes are normal and symmetric.  Psychiatric:        Behavior: Behavior normal.        Thought Content: Thought content normal.        Judgment: Judgment normal.    BP 110/82   Pulse 79   Temp 97.9 F (36.6 C) (Temporal)   Resp 20   Ht 5' 5" (1.651 m)   Wt 110 lb (49.9 kg)   SpO2 98%   BMI 18.30 kg/m        Assessment & Plan:  Amy Ingram comes in today with chief complaint of Annual Exam   Diagnosis and orders addressed:  1. Annual physical exam Labs pending - CBC  with Differential/Platelet - CMP14+EGFR - Lipid panel - Thyroid Panel With TSH - VITAMIN D 25 Hydroxy (Vit-D Deficiency, Fractures) - Cytology - PAP   Labs pending Health Maintenance reviewed Diet and exercise encouraged  Follow up plan: prn   Mary-Margaret Hassell Done, FNP

## 2020-07-20 NOTE — Patient Instructions (Signed)

## 2020-07-21 LAB — CMP14+EGFR
ALT: 16 IU/L (ref 0–32)
AST: 19 IU/L (ref 0–40)
Albumin/Globulin Ratio: 1.8 (ref 1.2–2.2)
Albumin: 4.9 g/dL — ABNORMAL HIGH (ref 3.8–4.8)
Alkaline Phosphatase: 75 IU/L (ref 44–121)
BUN/Creatinine Ratio: 14 (ref 9–23)
BUN: 11 mg/dL (ref 6–20)
Bilirubin Total: 1.1 mg/dL (ref 0.0–1.2)
CO2: 23 mmol/L (ref 20–29)
Calcium: 9.8 mg/dL (ref 8.7–10.2)
Chloride: 97 mmol/L (ref 96–106)
Creatinine, Ser: 0.78 mg/dL (ref 0.57–1.00)
Globulin, Total: 2.7 g/dL (ref 1.5–4.5)
Glucose: 84 mg/dL (ref 65–99)
Potassium: 4.3 mmol/L (ref 3.5–5.2)
Sodium: 139 mmol/L (ref 134–144)
Total Protein: 7.6 g/dL (ref 6.0–8.5)
eGFR: 102 mL/min/{1.73_m2} (ref >59)

## 2020-07-21 LAB — CBC WITH DIFFERENTIAL/PLATELET
Basophils Absolute: 0.1 10*3/uL (ref 0.0–0.2)
Basos: 1 %
EOS (ABSOLUTE): 0.1 10*3/uL (ref 0.0–0.4)
Eos: 1 %
Hematocrit: 44 % (ref 34.0–46.6)
Hemoglobin: 14.5 g/dL (ref 11.1–15.9)
Immature Grans (Abs): 0 10*3/uL (ref 0.0–0.1)
Immature Granulocytes: 0 %
Lymphocytes Absolute: 1.5 10*3/uL (ref 0.7–3.1)
Lymphs: 25 %
MCH: 32.2 pg (ref 26.6–33.0)
MCHC: 33 g/dL (ref 31.5–35.7)
MCV: 98 fL — ABNORMAL HIGH (ref 79–97)
Monocytes Absolute: 0.4 10*3/uL (ref 0.1–0.9)
Monocytes: 7 %
Neutrophils Absolute: 4.1 10*3/uL (ref 1.4–7.0)
Neutrophils: 66 %
Platelets: 256 10*3/uL (ref 150–450)
RBC: 4.5 x10E6/uL (ref 3.77–5.28)
RDW: 13.1 % (ref 11.7–15.4)
WBC: 6.2 10*3/uL (ref 3.4–10.8)

## 2020-07-21 LAB — LIPID PANEL
Chol/HDL Ratio: 2.4 ratio (ref 0.0–4.4)
Cholesterol, Total: 146 mg/dL (ref 100–199)
HDL: 62 mg/dL (ref >39)
LDL Chol Calc (NIH): 29 mg/dL (ref 0–99)
Triglycerides: 397 mg/dL — ABNORMAL HIGH (ref 0–149)
VLDL Cholesterol Cal: 55 mg/dL — ABNORMAL HIGH (ref 5–40)

## 2020-07-21 LAB — THYROID PANEL WITH TSH
Free Thyroxine Index: 1.3 (ref 1.2–4.9)
T3 Uptake Ratio: 22 % — ABNORMAL LOW (ref 24–39)
T4, Total: 5.7 ug/dL (ref 4.5–12.0)
TSH: 0.839 u[IU]/mL (ref 0.450–4.500)

## 2020-07-21 LAB — VITAMIN D 25 HYDROXY (VIT D DEFICIENCY, FRACTURES): Vit D, 25-Hydroxy: 26.3 ng/mL — ABNORMAL LOW (ref 30.0–100.0)

## 2020-07-22 LAB — CYTOLOGY - PAP
Chlamydia: NEGATIVE
Comment: NEGATIVE
Comment: NEGATIVE
Comment: NORMAL
Diagnosis: NEGATIVE
Neisseria Gonorrhea: NEGATIVE
Trichomonas: NEGATIVE

## 2020-07-22 IMAGING — MR MRI LUMBAR SPINE WITHOUT CONTRAST
4 of 5 series · 19 of 48 positions shown · non-contrast
Comparison: MR lumbar spine dated September 02, 2017.

CLINICAL DATA: Left leg pain, numbness, and tingling. Left
sacroiliac joint fusion in [REDACTED].

EXAM:
MRI LUMBAR SPINE WITHOUT CONTRAST
TECHNIQUE: Multiplanar, multisequence MR imaging of the lumbar spine was
performed. No intravenous contrast was administered.

[Series 4: T1 · sagittal · 4.0mm · 0.51mm/px · 3 of 12 slices shown (1 of 2)]
[im 1/12]
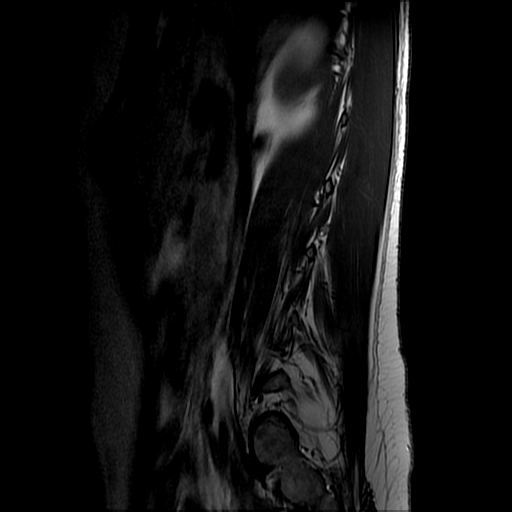
[im 8/12]
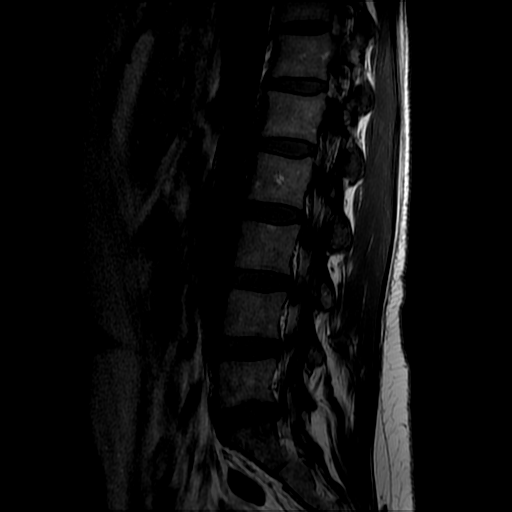
[im 12/12]
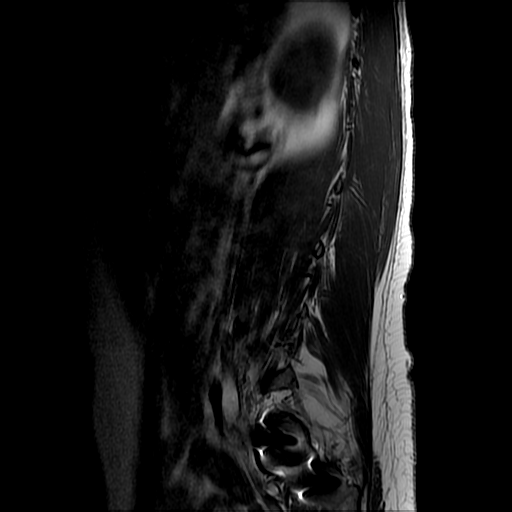

[Series 5: T2 post-contrast · sagittal · 4.0mm · 0.51mm/px · 5 of 12 slices shown]
[im 1/12]
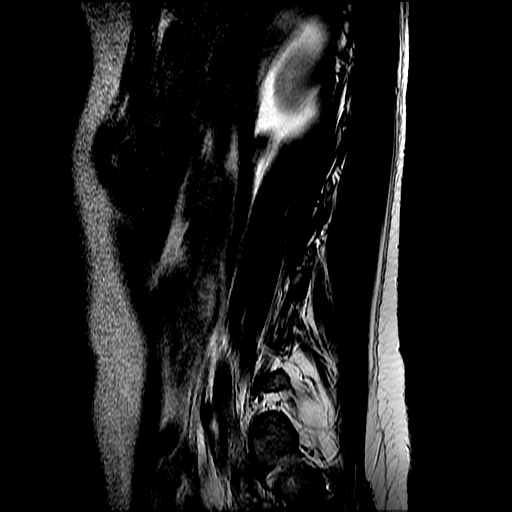
[im 3/12]
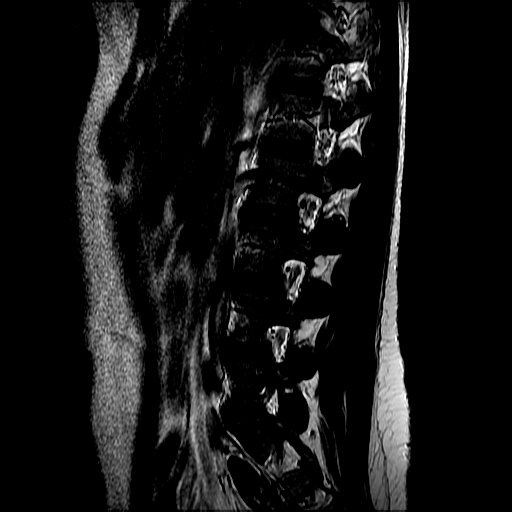
[im 6/12]
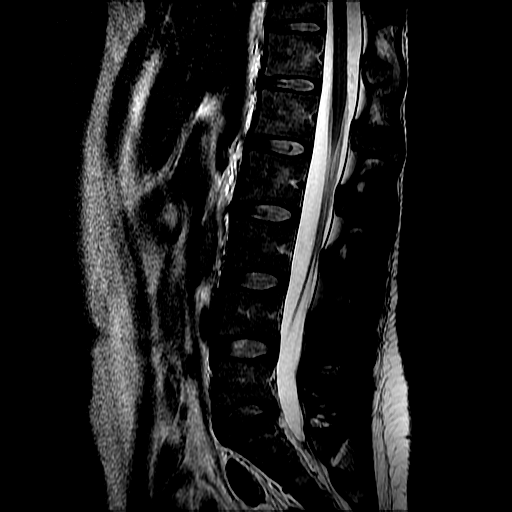
[im 9/12]
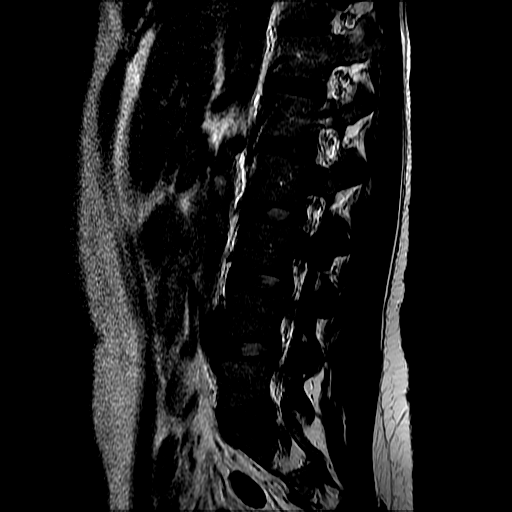
[im 12/12]
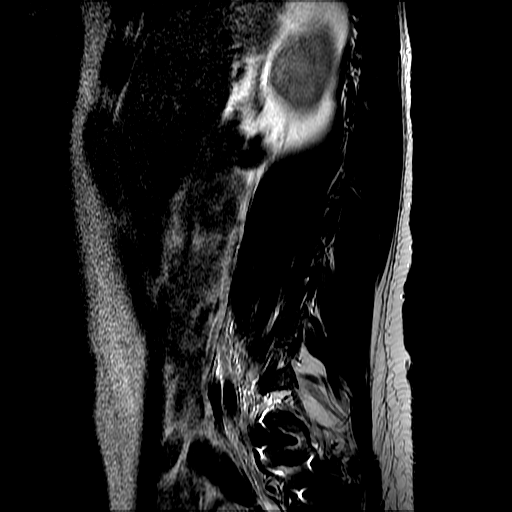

[Series 7: T2 · axial · 4.0mm · 0.39mm/px · z∈[-207,-3]mm · 8 of 44 slices shown]
[im 3/44]
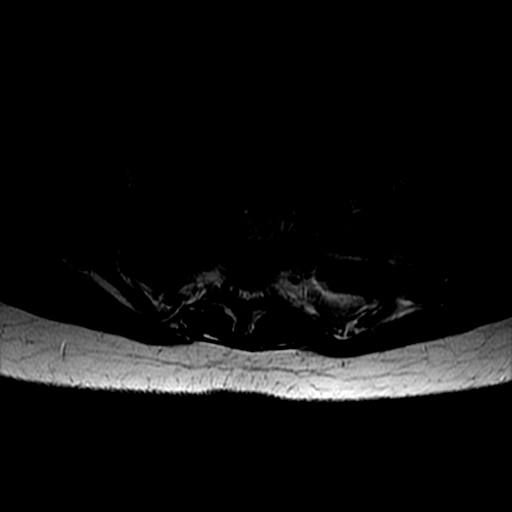
[im 6/44]
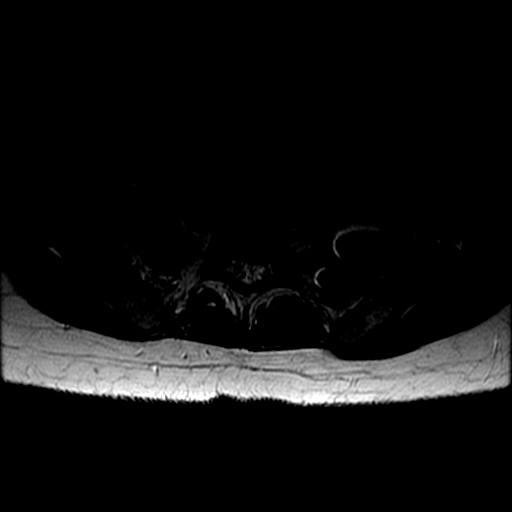
[im 9/44]
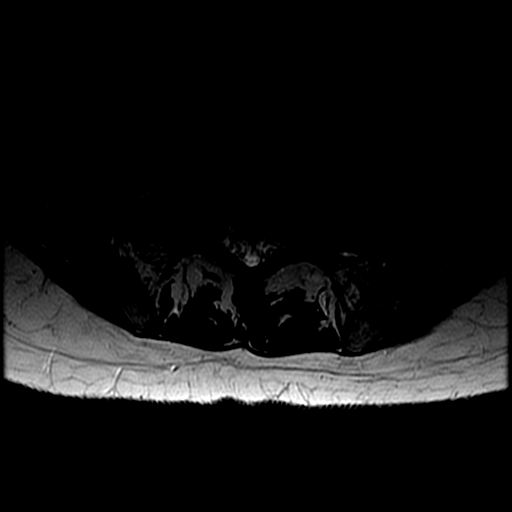
[im 14/44]
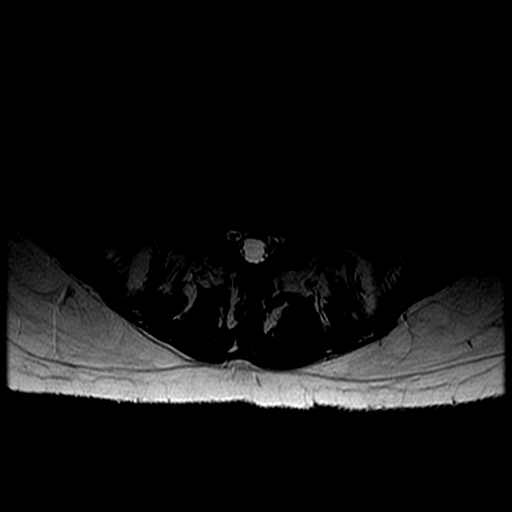
[im 19/44]
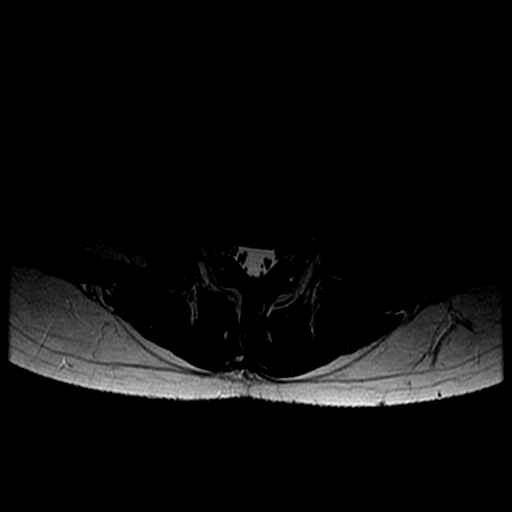
[im 22/44]
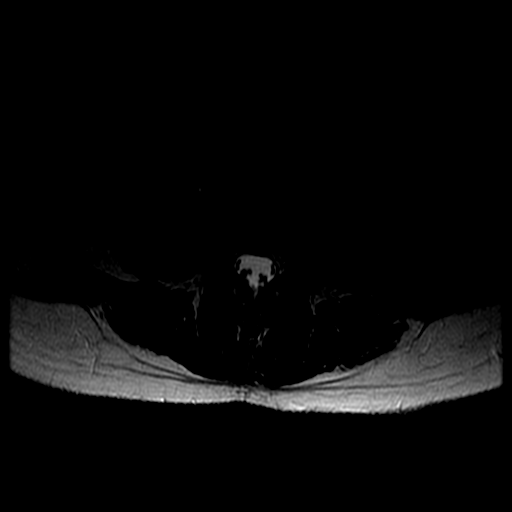
[im 25/44]
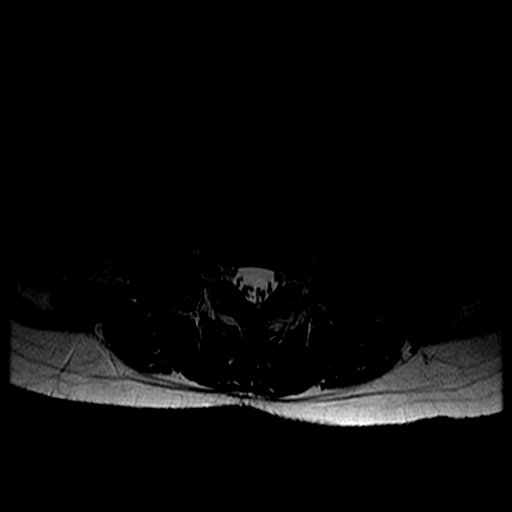
[im 38/44]
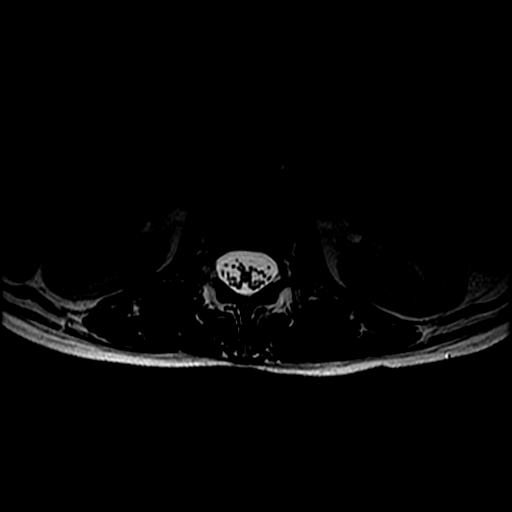

[Series 8: T1 · axial · 4.0mm · 0.39mm/px · z∈[-192,-3]mm · 3 of 44 slices shown (2 of 2)]
[im 6/44]
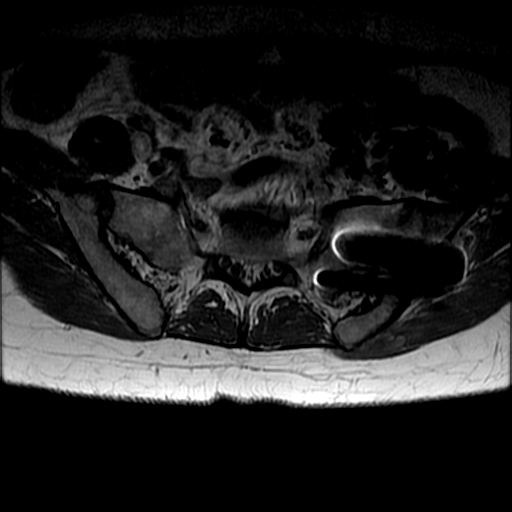
[im 22/44]
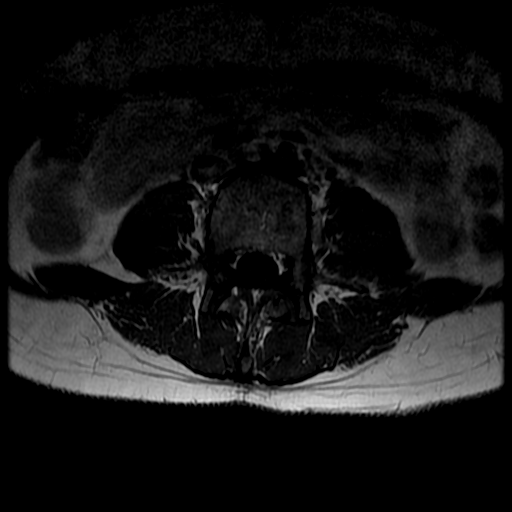
[im 38/44]
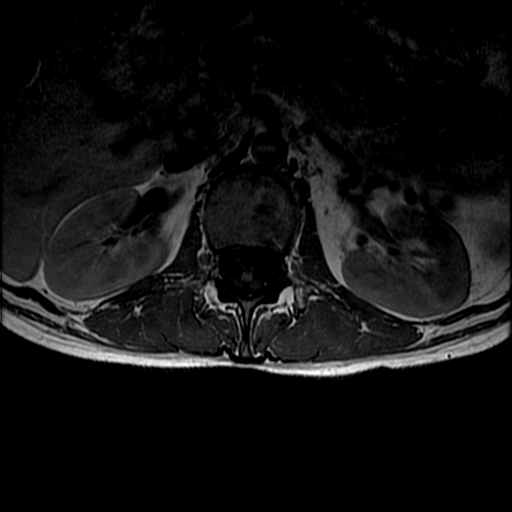

[19 of 48 positions shown; findings below may reference images not displayed]

FINDINGS: Segmentation:  Standard.

Alignment:  Physiologic.

Vertebrae:  No fracture, evidence of discitis, or bone lesion.

Conus medullaris and cauda equina: Conus extends to the L1-L2 level.
Conus and cauda equina appear normal.

Paraspinal and other soft tissues: Interval left sacroiliac joint
fusion. Otherwise negative.

Disc levels:

T12-L1 to L4-L5:  Negative.

L5-S1: Unchanged small left paracentral and subarticular disc
protrusion with mild left lateral recess stenosis. No spinal canal
or neuroforaminal stenosis.
IMPRESSION: 1. Unchanged small left-sided disc protrusion at L5-S1 with mild
left lateral recess stenosis.

## 2020-08-04 ENCOUNTER — Encounter: Payer: Self-pay | Admitting: Nurse Practitioner

## 2020-08-04 ENCOUNTER — Ambulatory Visit (INDEPENDENT_AMBULATORY_CARE_PROVIDER_SITE_OTHER): Payer: No Typology Code available for payment source | Admitting: Nurse Practitioner

## 2020-08-04 DIAGNOSIS — S060X1A Concussion with loss of consciousness of 30 minutes or less, initial encounter: Secondary | ICD-10-CM

## 2020-08-04 DIAGNOSIS — S060X1D Concussion with loss of consciousness of 30 minutes or less, subsequent encounter: Secondary | ICD-10-CM

## 2020-08-04 NOTE — Patient Instructions (Signed)
Concussion, Adult  A concussion is a brain injury from a hard, direct hit (trauma) to your head or body. This direct hit causes your brain to quickly shake back and forth inside your skull. A concussion may also be called a mild traumaticbrain injury (TBI). Healing from this injury can take time. What are the causes? This condition is caused by: A direct hit to your head, such as: Running into a player during a game. Being hit in a fight. Hitting your head on a hard surface. A quick and sudden movement of the head or neck, such as in a car crash. What are the signs or symptoms? The signs of a concussion can be hard to notice. They may be missed by you, family members, and doctors. You may look fine on the outside but may not actor feel normal. Physical symptoms Headaches. Being dizzy. Problems with body balance. Being sensitive to light or noise. Vomiting or feeling like you may vomit. Being tired. Problems seeing or hearing. Not sleeping or eating as you used to. Seizure. Mental and emotional symptoms Feeling grouchy (irritable). Having mood changes. Problems remembering things. Trouble focusing your mind (concentrating), organizing, or making decisions. Being slow to think, act, react, speak, or read. Feeling worried or nervous (anxious). Feeling sad (depressed). How is this treated? This condition may be treated by: Stopping sports or activity if you are injured. If you hit your head or have signs of concussion: Do not return to sports or activities the same day. Get checked by a doctor before you return to your activities. Resting your body and your mind. Being watched carefully, often at home. Medicines to help with symptoms such as: Headaches. Feeling like you may vomit. Problems with sleep. Avoiding alcohol and drugs. Being asked to go to a concussion clinic or a place to help you recover (rehabilitation center). Recovery from a concussion can take time. Return to  activities only: When you are fully healed. When your doctor says it is safe. Avoid taking strong pain medicines (opioids) for a concussion. Follow these instructions at home: Activity Limit activities that need a lot of thought or focus, such as: Homework or work for your job. Watching TV. Using the computer or phone. Playing memory games and puzzles. Rest. Rest helps your brain heal. Make sure you: Get plenty of sleep. Most adults should get 7-9 hours of sleep each night. Rest during the day. Take naps or breaks when you feel tired. Avoid activity like exercise until your doctor says its safe. Stop any activity that makes symptoms worse. Do not do activities that could cause a second concussion, such as riding a bike or playing sports. Ask your doctor when you can return to your normal activities, such as school, work, sports, and driving. Your ability to react may be slower. Do not do these activities if you are dizzy. General instructions  Take over-the-counter and prescription medicines only as told by your doctor. Do not drink alcohol until your doctor says you can. Watch your symptoms and tell other people to do the same. Other problems can occur after a concussion. Older adults have a higher risk of serious problems. Tell your work Production designer, theatre/television/film, teachers, Tax adviser, school counselor, coach, or Event organiser about your injury and symptoms. Tell them about what you can or cannot do. Keep all follow-up visits as told by your doctor. This is important.  How is this prevented? It is very important that you do not get another brain injury. In rare  cases, another injury can cause brain damage that will not go away, brain swelling, or death. The risk of this is greatest in the first 7-10 days after a head injury. To avoid injuries: Stop activities that could lead to a second concussion, such as contact sports, until your doctor says it is okay. When you return to sports or  activities: Do not crash into other players. This is how most concussions happen. Follow the rules. Respect other players. Do not engage in violent behavior while playing. Get regular exercise. Do strength and balance training. Wear a helmet that fits you well during sports, biking, or other activities. Helmets can help protect you from serious skull and brain injuries, but they do not protect you from a concussion. Even when wearing a helmet, you should avoid being hit in the head. Contact a doctor if: Your symptoms do not get better. You have new symptoms. You have another injury. Get help right away if: You have bad headaches or your headaches get worse. You feel weak or numb in any part of your body. You feel mixed up (confused). Your balance gets worse. You vomit often. You feel more sleepy than normal. You cannot speak well, or have slurred speech. You have a seizure. Others have trouble waking you up. You have changes in how you act. You have changes in how you see (vision). You pass out (lose consciousness). These symptoms may be an emergency. Do not wait to see if the symptoms will go away. Get medical help right away. Call your local emergency services (911 in the [place] Do not drive yourself to the hospital. Summary A concussion is a brain injury from a hard, direct hit (trauma) to your head or body. This condition is treated with rest and careful watching of symptoms. Ask your doctor when you can return to your normal activities, such as school, work, or driving. Get help right away if you have a very bad headache, feel weak in any part of your body, have a seizure, have changes in how you act or see, or if you are mixed up or more sleepy than normal. This information is not intended to replace advice given to you by your health care provider. Make sure you discuss any questions you have with your healthcare provider. Document Revised: 11/01/2018 Document Reviewed:  11/01/2018 Elsevier Patient Education  2022 Elsevier Inc.  
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
## 2021-04-13 ENCOUNTER — Encounter: Payer: Self-pay | Admitting: Nurse Practitioner

## 2021-04-13 ENCOUNTER — Telehealth (INDEPENDENT_AMBULATORY_CARE_PROVIDER_SITE_OTHER): Payer: No Typology Code available for payment source | Admitting: Nurse Practitioner

## 2021-04-13 DIAGNOSIS — H1033 Unspecified acute conjunctivitis, bilateral: Secondary | ICD-10-CM

## 2021-04-13 DIAGNOSIS — J0111 Acute recurrent frontal sinusitis: Secondary | ICD-10-CM | POA: Diagnosis not present

## 2021-04-13 MED ORDER — BACITRACIN-POLYMYXIN B 500-10000 UNIT/GM OP OINT: 1.0000 "application " | TOPICAL_OINTMENT | Freq: Two times a day (BID) | OPHTHALMIC | 0 refills | Status: DC

## 2021-04-13 MED ORDER — AMOXICILLIN-POT CLAVULANATE 875-125 MG PO TABS: 1.0000 | ORAL_TABLET | Freq: Two times a day (BID) | ORAL | 0 refills | Status: DC

## 2021-04-13 MED ORDER — PROBIOTIC (LACTOBACILLUS) PO CAPS: 1.0000 | ORAL_CAPSULE | Freq: Every day | ORAL | 0 refills | Status: DC

## 2021-04-13 NOTE — Progress Notes (Signed)
? ?  Virtual Visit  Note ?Due to COVID-19 pandemic this visit was conducted virtually. This visit type was conducted due to national recommendations for restrictions regarding the COVID-19 Pandemic (e.g. social distancing, sheltering in place) in an effort to limit this patient's exposure and mitigate transmission in our community. All issues noted in this document were discussed and addressed.  A physical exam was not performed with this format. ? ?I connected with Amy Ingram on 04/13/21 at 12:30 pm  by telephone and verified that I am speaking with the correct person using two identifiers. Amy Ingram is currently located at home during visit. The provider, Daryll Drown, NP is located in their office at time of visit. ? ?I discussed the limitations, risks, security and privacy concerns of performing an evaluation and management service by telephone and the availability of in person appointments. I also discussed with the patient that there may be a patient responsible charge related to this service. The patient expressed understanding and agreed to proceed. ? ? ?History and Present Illness: ? ?Sinusitis ?This is a new problem. Episode onset: in the past 4-5 days. The problem is unchanged. There has been no fever. The pain is moderate. Associated symptoms include congestion, headaches and sinus pressure. Pertinent negatives include no chills, coughing, ear pain or shortness of breath. Past treatments include nothing.  ? ? ? ?Review of Systems  ?Constitutional:  Negative for chills and fever.  ?HENT:  Positive for congestion and sinus pressure. Negative for ear discharge and ear pain.   ?Eyes:  Positive for pain, discharge and redness.  ?Respiratory:  Negative for cough and shortness of breath.   ?Cardiovascular: Negative.   ?Skin:  Negative for rash.  ?Neurological:  Positive for headaches.  ?All other systems reviewed and are negative. ? ? ?Observations/Objective: ?Tele visit patient not in  distress ? ?Assessment and Plan: ? ?Patient positive for conjunctivitis ans sinusitis ? ?Take meds as prescribed ?- Use a cool mist humidifier  ?-Use saline nose sprays frequently ?-Force fluids ?-For fever or aches or pains- take Tylenol or ibuprofen. ?-If symptoms do not improve, she may need to be COVID tested to rule this out ?Follow up with worsening unresolved symptoms  ? ?Follow Up Instructions: ?Follow up with worsening unresolved symptoms ? ?  ?I discussed the assessment and treatment plan with the patient. The patient was provided an opportunity to ask questions and all were answered. The patient agreed with the plan and demonstrated an understanding of the instructions. ?  ?The patient was advised to call back or seek an in-person evaluation if the symptoms worsen or if the condition fails to improve as anticipated. ? ?The above assessment and management plan was discussed with the patient. The patient verbalized understanding of and has agreed to the management plan. Patient is aware to call the clinic if symptoms persist or worsen. Patient is aware when to return to the clinic for a follow-up visit. Patient educated on when it is appropriate to go to the emergency department.  ? ?Time call ended:  12:43 pm  ? ?I provided 13 minutes of  non face-to-face time during this encounter. ? ? ? ?Daryll Drown, NP ?  ?

## 2021-07-22 ENCOUNTER — Ambulatory Visit (INDEPENDENT_AMBULATORY_CARE_PROVIDER_SITE_OTHER): Payer: No Typology Code available for payment source | Admitting: Nurse Practitioner

## 2021-07-22 ENCOUNTER — Encounter: Payer: Self-pay | Admitting: Nurse Practitioner

## 2021-07-22 ENCOUNTER — Other Ambulatory Visit (HOSPITAL_COMMUNITY)
Admission: RE | Admit: 2021-07-22 | Discharge: 2021-07-22 | Disposition: A | Discharge status: Home / Self Care | Payer: No Typology Code available for payment source | Source: Ambulatory Visit | Attending: Nurse Practitioner | Admitting: Nurse Practitioner

## 2021-07-22 VITALS — BP 115/80 | HR 85 | Temp 98.0°F | Resp 20 | Ht 65.0 in | Wt 112.0 lb

## 2021-07-22 DIAGNOSIS — Z0000 Encounter for general adult medical examination without abnormal findings: Secondary | ICD-10-CM | POA: Insufficient documentation

## 2021-07-22 LAB — URINALYSIS, COMPLETE
Bilirubin, UA: NEGATIVE
Glucose, UA: NEGATIVE
Ketones, UA: NEGATIVE
Leukocytes,UA: NEGATIVE
Nitrite, UA: NEGATIVE
Protein,UA: NEGATIVE
RBC, UA: NEGATIVE
Specific Gravity, UA: >1.03 — ABNORMAL HIGH (ref 1.005–1.030)
Urobilinogen, Ur: 0.2 mg/dL (ref 0.2–1.0)
pH, UA: 5 (ref 5.0–7.5)

## 2021-07-22 LAB — MICROSCOPIC EXAMINATION
RBC, Urine: NONE SEEN /hpf (ref 0–2)
Renal Epithel, UA: NONE SEEN /hpf

## 2021-07-22 NOTE — Progress Notes (Signed)
Subjective:    Patient ID: Amy Ingram, female    DOB: [birth year], 36 y.o.   MRN: [id number]   Chief Complaint: Annual Exam    HPI:  Amy Ingram is a 36 y.o. who identifies as a female who was assigned female at birth.   Social history: Lives with: by herself Work history: works at Marine scientist at [employer] in today for follow up of the following chronic medical issues:  1. Annual physical exam Is having pap today Is on no chronic meds and has mo complaints today   New complaints: None today  No Known Allergies Outpatient Encounter Medications as of 07/22/2021  Medication Sig   cyclobenzaprine (FLEXERIL) 5 MG tablet Take 1 tablet (5 mg total) by mouth 3 (three) times daily as needed for muscle spasms.   fluticasone (FLONASE) 50 MCG/ACT nasal spray Place 2 sprays into both nostrils daily.   meclizine (ANTIVERT) 25 MG tablet Take 1 tablet (25 mg total) by mouth 3 (three) times daily as needed for dizziness.   methocarbamol (ROBAXIN) 500 MG tablet Take 1 tablet (500 mg total) by mouth 2 (two) times daily.   Probiotic, Lactobacillus, CAPS Take 1 capsule by mouth daily at 2 am.   [DISCONTINUED] amoxicillin-clavulanate (AUGMENTIN) 875-125 MG tablet Take 1 tablet by mouth 2 (two) times daily.   [DISCONTINUED] bacitracin-polymyxin b (POLYSPORIN) ophthalmic ointment Place 1 application. into the right eye every 12 (twelve) hours. apply to eye every 12 hours while awake   No facility-administered encounter medications on file as of 07/22/2021.    Past Surgical History:  Procedure Laterality Date   DENTAL SURGERY  2006   dental implants 2006   SACROILIAC JOINT FUSION Left 01/18/2018   Procedure: LEFT SACROILIAC JOINT FUSION;  Surgeon: Phylliss Bob, MD;  Location: [facility];  Service: Orthopedics;  Laterality: Left;    History reviewed. No pertinent family history.    Controlled substance contract: n/a     Review of Systems  Constitutional:  Negative for  diaphoresis.  Eyes:  Negative for pain.  Respiratory:  Negative for shortness of breath.   Cardiovascular:  Negative for chest pain, palpitations and leg swelling.  Gastrointestinal:  Negative for abdominal pain.  Endocrine: Negative for polydipsia.  Skin:  Negative for rash.  Neurological:  Negative for dizziness, weakness and headaches.  Hematological:  Does not bruise/bleed easily.  All other systems reviewed and are negative.      Objective:   Physical Exam Vitals and nursing note reviewed.  Constitutional:      General: She is not in acute distress.    Appearance: Normal appearance. She is well-developed.  HENT:     Head: Normocephalic.     Right Ear: Tympanic membrane normal.     Left Ear: Tympanic membrane normal.     Nose: Nose normal.     Mouth/Throat:     Mouth: Mucous membranes are moist.  Eyes:     Pupils: Pupils are equal, round, and reactive to light.  Neck:     Vascular: No carotid bruit or JVD.  Cardiovascular:     Rate and Rhythm: Normal rate and regular rhythm.     Heart sounds: Normal heart sounds.  Pulmonary:     Effort: Pulmonary effort is normal. No respiratory distress.     Breath sounds: Normal breath sounds. No wheezing or rales.  Chest:     Chest wall: No tenderness.  Abdominal:     General: Bowel sounds are normal.  There is no distension or abdominal bruit.     Palpations: Abdomen is soft. There is no hepatomegaly, splenomegaly, mass or pulsatile mass.     Tenderness: There is no abdominal tenderness.  Genitourinary:    General: Normal vulva.     Vagina: No vaginal discharge.     Rectum: Normal.     Comments: Cervix nonparous No adnexal masses or tenderness Musculoskeletal:        General: Normal range of motion.     Cervical back: Normal range of motion and neck supple.  Lymphadenopathy:     Cervical: No cervical adenopathy.  Skin:    General: Skin is warm and dry.  Neurological:     Mental Status: She is alert and oriented to  person, place, and time.     Deep Tendon Reflexes: Reflexes are normal and symmetric.  Psychiatric:        Behavior: Behavior normal.        Thought Content: Thought content normal.        Judgment: Judgment normal.    BP 115/80   Pulse 85   Temp 98 F (36.7 C) (Temporal)   Resp 20   Ht _0  (1.651 m)   Wt 112 lb (50.8 kg)   SpO2 100%   BMI 18.64 kg/m         Assessment & Plan:   Amy Ingram comes in today with chief complaint of Annual Exam   Diagnosis and orders addressed:  1. Annual physical exam Labs oending - CBC with Differential/Platelet - CMP14+EGFR - Lipid panel - Thyroid Panel With TSH - Cytology - PAP - Urinalysis, Complete   Labs pending Health Maintenance reviewed Diet and exercise encouraged  Follow up plan: 1 year and prn   Mary-Margaret Hassell Done, FNP

## 2021-07-22 NOTE — Patient Instructions (Signed)
Exercising to Stay Healthy To become healthy and stay healthy, it is recommended that you do moderate-intensity and vigorous-intensity exercise. You can tell that you are exercising at a moderate intensity if your heart starts beating faster and you start breathing faster but can still hold a conversation. You can tell that you are exercising at a vigorous intensity if you are breathing much harder and faster and cannot hold a conversation while exercising. How can exercise benefit me? Exercising regularly is important. It has many health benefits, such as: Improving overall fitness, flexibility, and endurance. Increasing bone density. Helping with weight control. Decreasing body fat. Increasing muscle strength and endurance. Reducing stress and tension, anxiety, depression, or anger. Improving overall health. What guidelines should I follow while exercising? Before you start a new exercise program, talk with your health care provider. Do not exercise so much that you hurt yourself, feel dizzy, or get very short of breath. Wear comfortable clothes and wear shoes with good support. Drink plenty of water while you exercise to prevent dehydration or heat stroke. Work out until your breathing and your heartbeat get faster (moderate intensity). How often should I exercise? Choose an activity that you enjoy, and set realistic goals. Your health care provider can help you make an activity plan that is individually designed and works best for you. Exercise regularly as told by your health care provider. This may include: Doing strength training two times a week, such as: Lifting weights. Using resistance bands. Push-ups. Sit-ups. Yoga. Doing a certain intensity of exercise for a given amount of time. Choose from these options: A total of 150 minutes of moderate-intensity exercise every week. A total of 75 minutes of vigorous-intensity exercise every week. A mix of moderate-intensity and  vigorous-intensity exercise every week. Children, pregnant women, people who have not exercised regularly, people who are overweight, and older adults may need to talk with a health care provider about what activities are safe to perform. If you have a medical condition, be sure to talk with your health care provider before you start a new exercise program. What are some exercise ideas? Moderate-intensity exercise ideas include: Walking 1 mile (1.6 km) in about 15 minutes. Biking. Hiking. Golfing. Dancing. Water aerobics. Vigorous-intensity exercise ideas include: Walking 4.5 miles (7.2 km) or more in about 1 hour. Jogging or running 5 miles (8 km) in about 1 hour. Biking 10 miles (16.1 km) or more in about 1 hour. Lap swimming. Roller-skating or in-line skating. Cross-country skiing. Vigorous competitive sports, such as football, basketball, and soccer. Jumping rope. Aerobic dancing. What are some everyday activities that can help me get exercise? Yard work, such as: Pushing a lawn mower. Raking and bagging leaves. Washing your car. Pushing a stroller. Shoveling snow. Gardening. Washing windows or floors. How can I be more active in my day-to-day activities? Use stairs instead of an elevator. Take a walk during your lunch break. If you drive, park your car farther away from your work or school. If you take public transportation, get off one stop early and walk the rest of the way. Stand up or walk around during all of your indoor phone calls. Get up, stretch, and walk around every 30 minutes throughout the day. Enjoy exercise with a friend. Support to continue exercising will help you keep a regular routine of activity. Where to find more information You can find more information about exercising to stay healthy from: [place] Department of Health and Human Services: www.hhs.gov Centers for Disease Control and Prevention (  CDC): www.cdc.gov Summary Exercising regularly is  important. It will improve your overall fitness, flexibility, and endurance. Regular exercise will also improve your overall health. It can help you control your weight, reduce stress, and improve your bone density. Do not exercise so much that you hurt yourself, feel dizzy, or get very short of breath. Before you start a new exercise program, talk with your health care provider. This information is not intended to replace advice given to you by your health care provider. Make sure you discuss any questions you have with your health care provider. Document Revised: 04/17/2020 Document Reviewed: 04/17/2020 Elsevier Patient Education  2023 Elsevier Inc.  

## 2021-07-23 LAB — CMP14+EGFR
ALT: 14 IU/L (ref 0–32)
AST: 15 IU/L (ref 0–40)
Albumin/Globulin Ratio: 1.8 (ref 1.2–2.2)
Albumin: 4.6 g/dL (ref 3.9–4.9)
Alkaline Phosphatase: 51 IU/L (ref 44–121)
BUN/Creatinine Ratio: 18 (ref 9–23)
BUN: 11 mg/dL (ref 6–20)
Bilirubin Total: 0.3 mg/dL (ref 0.0–1.2)
CO2: 23 mmol/L (ref 20–29)
Calcium: 9.5 mg/dL (ref 8.7–10.2)
Chloride: 100 mmol/L (ref 96–106)
Creatinine, Ser: 0.62 mg/dL (ref 0.57–1.00)
Globulin, Total: 2.5 g/dL (ref 1.5–4.5)
Glucose: 96 mg/dL (ref 70–99)
Potassium: 4.3 mmol/L (ref 3.5–5.2)
Sodium: 137 mmol/L (ref 134–144)
Total Protein: 7.1 g/dL (ref 6.0–8.5)
eGFR: 119 mL/min/{1.73_m2} (ref >59)

## 2021-07-23 LAB — THYROID PANEL WITH TSH
Free Thyroxine Index: 1.2 (ref 1.2–4.9)
T3 Uptake Ratio: 23 % — ABNORMAL LOW (ref 24–39)
T4, Total: 5.2 ug/dL (ref 4.5–12.0)
TSH: 0.821 u[IU]/mL (ref 0.450–4.500)

## 2021-07-23 LAB — LIPID PANEL
Chol/HDL Ratio: 2.1 ratio (ref 0.0–4.4)
Cholesterol, Total: 158 mg/dL (ref 100–199)
HDL: 76 mg/dL (ref >39)
LDL Chol Calc (NIH): 54 mg/dL (ref 0–99)
Triglycerides: 176 mg/dL — ABNORMAL HIGH (ref 0–149)
VLDL Cholesterol Cal: 28 mg/dL (ref 5–40)

## 2021-07-23 LAB — CBC WITH DIFFERENTIAL/PLATELET
Basophils Absolute: 0 10*3/uL (ref 0.0–0.2)
Basos: 1 %
EOS (ABSOLUTE): 0.4 10*3/uL (ref 0.0–0.4)
Eos: 6 %
Hematocrit: 40.2 % (ref 34.0–46.6)
Hemoglobin: 13.8 g/dL (ref 11.1–15.9)
Immature Grans (Abs): 0 10*3/uL (ref 0.0–0.1)
Immature Granulocytes: 0 %
Lymphocytes Absolute: 1.9 10*3/uL (ref 0.7–3.1)
Lymphs: 32 %
MCH: 33.1 pg — ABNORMAL HIGH (ref 26.6–33.0)
MCHC: 34.3 g/dL (ref 31.5–35.7)
MCV: 96 fL (ref 79–97)
Monocytes Absolute: 0.4 10*3/uL (ref 0.1–0.9)
Monocytes: 6 %
Neutrophils Absolute: 3.3 10*3/uL (ref 1.4–7.0)
Neutrophils: 55 %
Platelets: 252 10*3/uL (ref 150–450)
RBC: 4.17 x10E6/uL (ref 3.77–5.28)
RDW: 13.3 % (ref 11.7–15.4)
WBC: 6 10*3/uL (ref 3.4–10.8)

## 2021-07-27 LAB — CYTOLOGY - PAP
Chlamydia: NEGATIVE
Comment: NEGATIVE
Comment: NEGATIVE
Comment: NEGATIVE
Comment: NORMAL
Diagnosis: UNDETERMINED — AB
High risk HPV: NEGATIVE
Neisseria Gonorrhea: NEGATIVE
Trichomonas: NEGATIVE

## 2021-09-23 ENCOUNTER — Other Ambulatory Visit (HOSPITAL_BASED_OUTPATIENT_CLINIC_OR_DEPARTMENT_OTHER): Payer: Self-pay

## 2021-09-23 MED ORDER — FLUARIX QUADRIVALENT 0.5 ML IM SUSY
PREFILLED_SYRINGE | INTRAMUSCULAR | 0 refills | Status: DC
  Filled 2021-11-01: qty 0.5, 1d supply, fill #0

## 2021-09-29 ENCOUNTER — Other Ambulatory Visit (HOSPITAL_BASED_OUTPATIENT_CLINIC_OR_DEPARTMENT_OTHER): Payer: Self-pay

## 2021-09-29 ENCOUNTER — Telehealth: Payer: No Typology Code available for payment source | Admitting: Physician Assistant

## 2021-09-29 DIAGNOSIS — B9689 Other specified bacterial agents as the cause of diseases classified elsewhere: Secondary | ICD-10-CM | POA: Diagnosis not present

## 2021-09-29 DIAGNOSIS — J019 Acute sinusitis, unspecified: Secondary | ICD-10-CM | POA: Diagnosis not present

## 2021-09-29 MED ORDER — AMOXICILLIN-POT CLAVULANATE 875-125 MG PO TABS
1.0000 | ORAL_TABLET | Freq: Two times a day (BID) | ORAL | 0 refills | Status: DC
  Filled 2021-09-29: qty 14, 7d supply, fill #0

## 2021-09-29 NOTE — Progress Notes (Signed)
I have spent 5 minutes in review of e-visit questionnaire, review and updating patient chart, medical decision making and response to patient.   Boots Mcglown Cody Ryane Canavan, PA-C    

## 2021-09-29 NOTE — Progress Notes (Signed)

## 2021-11-01 ENCOUNTER — Other Ambulatory Visit (HOSPITAL_BASED_OUTPATIENT_CLINIC_OR_DEPARTMENT_OTHER): Payer: Self-pay

## 2022-04-12 ENCOUNTER — Other Ambulatory Visit (HOSPITAL_BASED_OUTPATIENT_CLINIC_OR_DEPARTMENT_OTHER): Payer: Self-pay

## 2022-04-20 ENCOUNTER — Telehealth: Payer: 59 | Admitting: Physician Assistant

## 2022-04-20 ENCOUNTER — Other Ambulatory Visit (HOSPITAL_BASED_OUTPATIENT_CLINIC_OR_DEPARTMENT_OTHER): Payer: Self-pay

## 2022-04-20 DIAGNOSIS — T3695XA Adverse effect of unspecified systemic antibiotic, initial encounter: Secondary | ICD-10-CM | POA: Diagnosis not present

## 2022-04-20 DIAGNOSIS — B379 Candidiasis, unspecified: Secondary | ICD-10-CM

## 2022-04-20 MED ORDER — FLUCONAZOLE 150 MG PO TABS
ORAL_TABLET | ORAL | 0 refills | Status: DC
Start: 2022-04-20 — End: 2022-07-05

## 2022-04-20 NOTE — Progress Notes (Signed)

## 2022-04-20 NOTE — Progress Notes (Signed)
I have spent 5 minutes in review of e-visit questionnaire, review and updating patient chart, medical decision making and response to patient.   Ardon Franklin Cody Rimas Gilham, PA-C    

## 2022-05-06 ENCOUNTER — Encounter: Payer: 59 | Admitting: Nurse Practitioner

## 2022-05-24 ENCOUNTER — Encounter: Payer: 59 | Admitting: Nurse Practitioner

## 2022-06-10 ENCOUNTER — Encounter: Payer: 59 | Admitting: Nurse Practitioner

## 2022-07-05 ENCOUNTER — Ambulatory Visit (INDEPENDENT_AMBULATORY_CARE_PROVIDER_SITE_OTHER): Payer: 59 | Admitting: Nurse Practitioner

## 2022-07-05 ENCOUNTER — Encounter: Payer: Self-pay | Admitting: Nurse Practitioner

## 2022-07-05 VITALS — BP 125/86 | HR 82 | Temp 98.2°F | Resp 20 | Ht 65.0 in | Wt 108.0 lb

## 2022-07-05 DIAGNOSIS — Z0000 Encounter for general adult medical examination without abnormal findings: Secondary | ICD-10-CM | POA: Diagnosis not present

## 2022-07-05 NOTE — Patient Instructions (Signed)
Exercising to Stay Healthy To become healthy and stay healthy, it is recommended that you do moderate-intensity and vigorous-intensity exercise. You can tell that you are exercising at a moderate intensity if your heart starts beating faster and you start breathing faster but can still hold a conversation. You can tell that you are exercising at a vigorous intensity if you are breathing much harder and faster and cannot hold a conversation while exercising. How can exercise benefit me? Exercising regularly is important. It has many health benefits, such as: Improving overall fitness, flexibility, and endurance. Increasing bone density. Helping with weight control. Decreasing body fat. Increasing muscle strength and endurance. Reducing stress and tension, anxiety, depression, or anger. Improving overall health. What guidelines should I follow while exercising? Before you start a new exercise program, talk with your health care provider. Do not exercise so much that you hurt yourself, feel dizzy, or get very short of breath. Wear comfortable clothes and wear shoes with good support. Drink plenty of water while you exercise to prevent dehydration or heat stroke. Work out until your breathing and your heartbeat get faster (moderate intensity). How often should I exercise? Choose an activity that you enjoy, and set realistic goals. Your health care provider can help you make an activity plan that is individually designed and works best for you. Exercise regularly as told by your health care provider. This may include: Doing strength training two times a week, such as: Lifting weights. Using resistance bands. Push-ups. Sit-ups. Yoga. Doing a certain intensity of exercise for a given amount of time. Choose from these options: A total of 150 minutes of moderate-intensity exercise every week. A total of 75 minutes of vigorous-intensity exercise every week. A mix of moderate-intensity and  vigorous-intensity exercise every week. Children, pregnant women, people who have not exercised regularly, people who are overweight, and older adults may need to talk with a health care provider about what activities are safe to perform. If you have a medical condition, be sure to talk with your health care provider before you start a new exercise program. What are some exercise ideas? Moderate-intensity exercise ideas include: Walking 1 mile (1.6 km) in about 15 minutes. Biking. Hiking. Golfing. Dancing. Water aerobics. Vigorous-intensity exercise ideas include: Walking 4.5 miles (7.2 km) or more in about 1 hour. Jogging or running 5 miles (8 km) in about 1 hour. Biking 10 miles (16.1 km) or more in about 1 hour. Lap swimming. Roller-skating or in-line skating. Cross-country skiing. Vigorous competitive sports, such as football, basketball, and soccer. Jumping rope. Aerobic dancing. What are some everyday activities that can help me get exercise? Yard work, such as: Pushing a lawn mower. Raking and bagging leaves. Washing your car. Pushing a stroller. Shoveling snow. Gardening. Washing windows or floors. How can I be more active in my day-to-day activities? Use stairs instead of an elevator. Take a walk during your lunch break. If you drive, park your car farther away from your work or school. If you take public transportation, get off one stop early and walk the rest of the way. Stand up or walk around during all of your indoor phone calls. Get up, stretch, and walk around every 30 minutes throughout the day. Enjoy exercise with a friend. Support to continue exercising will help you keep a regular routine of activity. Where to find more information You can find more information about exercising to stay healthy from: U.S. Department of Health and Human Services: www.hhs.gov Centers for Disease Control and Prevention (  CDC): www.cdc.gov Summary Exercising regularly is  important. It will improve your overall fitness, flexibility, and endurance. Regular exercise will also improve your overall health. It can help you control your weight, reduce stress, and improve your bone density. Do not exercise so much that you hurt yourself, feel dizzy, or get very short of breath. Before you start a new exercise program, talk with your health care provider. This information is not intended to replace advice given to you by your health care provider. Make sure you discuss any questions you have with your health care provider. Document Revised: 04/17/2020 Document Reviewed: 04/17/2020 Elsevier Patient Education  2024 Elsevier Inc.  

## 2022-07-05 NOTE — Progress Notes (Signed)
Subjective:    Patient ID: Amy Ingram, female    DOB: [birth year], 37 y.o.   MRN: [id number]   Chief Complaint: annual physical   HPI:  Amy Ingram is a 37 y.o. who identifies as a female who was assigned female at birth.   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 Annual physical exam No issues today   New complaints: None today  No Known Allergies Outpatient Encounter Medications as of 07/05/2022  Medication Sig   cyclobenzaprine (FLEXERIL) 5 MG tablet Take 1 tablet (5 mg total) by mouth 3 (three) times daily as needed for muscle spasms.   fluticasone (FLONASE) 50 MCG/ACT nasal spray Place 2 sprays into both nostrils daily.   influenza vac split quadrivalent PF (FLUARIX QUADRIVALENT) 0.5 ML injection Inject into the muscle.   meclizine (ANTIVERT) 25 MG tablet Take 1 tablet (25 mg total) by mouth 3 (three) times daily as needed for dizziness.   methocarbamol (ROBAXIN) 500 MG tablet Take 1 tablet (500 mg total) by mouth 2 (two) times daily.   Probiotic, Lactobacillus, CAPS Take 1 capsule by mouth daily at 2 am.   [DISCONTINUED] amoxicillin-clavulanate (AUGMENTIN) 875-125 MG tablet Take 1 tablet by mouth 2 (two) times daily.   [DISCONTINUED] fluconazole (DIFLUCAN) 150 MG tablet Take 1 tablet PO once. Repeat in 3 days if needed.   No facility-administered encounter medications on file as of 07/05/2022.    Past Surgical History:  Procedure Laterality Date   DENTAL SURGERY  2006   dental implants 2006   SACROILIAC JOINT FUSION Left 01/18/2018   Procedure: LEFT SACROILIAC JOINT FUSION;  Surgeon: Estill Bamberg, MD;  Location: [facility] OR;  Service: Orthopedics;  Laterality: Left;    No family history on file.    Controlled substance contract: n/a     Review of Systems  Constitutional:  Negative for diaphoresis.  Eyes:  Negative for pain.  Respiratory:   Negative for shortness of breath.   Cardiovascular:  Negative for chest pain, palpitations and leg swelling.  Gastrointestinal:  Negative for abdominal pain.  Endocrine: Negative for polydipsia.  Skin:  Negative for rash.  Neurological:  Negative for dizziness, weakness and headaches.  Hematological:  Does not bruise/bleed easily.  All other systems reviewed and are negative.      Objective:   Physical Exam Vitals and nursing note reviewed.  Constitutional:      General: She is not in acute distress.    Appearance: Normal appearance. She is well-developed.  HENT:     Head: Normocephalic.     Right Ear: Tympanic membrane normal.     Left Ear: Tympanic membrane normal.     Nose: Nose normal.     Mouth/Throat:     Mouth: Mucous membranes are moist.  Eyes:     Pupils: Pupils are equal, round, and reactive to light.  Neck:     Vascular: No carotid bruit or JVD.  Cardiovascular:     Rate and Rhythm: Normal rate and regular rhythm.     Heart sounds: Normal heart sounds.  Pulmonary:     Effort: Pulmonary effort is normal. No respiratory distress.     Breath sounds: Normal breath sounds. No wheezing or rales.  Chest:     Chest wall: No tenderness.  Abdominal:     General: Bowel sounds are normal. There is no distension or abdominal bruit.  Palpations: Abdomen is soft. There is no hepatomegaly, splenomegaly, mass or pulsatile mass.     Tenderness: There is no abdominal tenderness.  Musculoskeletal:        General: Normal range of motion.     Cervical back: Normal range of motion and neck supple.  Lymphadenopathy:     Cervical: No cervical adenopathy.  Skin:    General: Skin is warm and dry.  Neurological:     Mental Status: She is alert and oriented to person, place, and time.     Deep Tendon Reflexes: Reflexes are normal and symmetric.  Psychiatric:        Behavior: Behavior normal.        Thought Content: Thought content normal.        Judgment: Judgment normal.      BP 125/86   Pulse 82   Temp 98.2 F (36.8 C) (Temporal)   Resp 20   Ht 5\' 5"  (1.651 m)   Wt 108 lb (49 kg)   SpO2 100%   BMI 17.97 kg/m        Assessment & Plan:   TSUNEKO ZHAN comes in today with chief complaint of Annual Exam   Diagnosis and orders addressed:  1. Annual physical exam - CBC with Differential/Platelet - CMP14+EGFR - Lipid panel - Thyroid Panel With TSH - VITAMIN D 25 Hydroxy (Vit-D Deficiency, Fractures)   Labs pending Health Maintenance reviewed Diet and exercise encouraged  Follow up plan: 1 year   Mary-Margaret Daphine Deutscher, FNP

## 2022-07-06 LAB — CMP14+EGFR
ALT: 19 IU/L (ref 0–32)
AST: 18 IU/L (ref 0–40)
Albumin: 4.6 g/dL (ref 3.9–4.9)
Alkaline Phosphatase: 58 IU/L (ref 44–121)
BUN/Creatinine Ratio: 16 (ref 9–23)
BUN: 10 mg/dL (ref 6–20)
Bilirubin Total: 0.4 mg/dL (ref 0.0–1.2)
CO2: 23 mmol/L (ref 20–29)
Calcium: 9.5 mg/dL (ref 8.7–10.2)
Chloride: 100 mmol/L (ref 96–106)
Creatinine, Ser: 0.63 mg/dL (ref 0.57–1.00)
Globulin, Total: 2.4 g/dL (ref 1.5–4.5)
Glucose: 94 mg/dL (ref 70–99)
Potassium: 3.9 mmol/L (ref 3.5–5.2)
Sodium: 136 mmol/L (ref 134–144)
Total Protein: 7 g/dL (ref 6.0–8.5)
eGFR: 118 mL/min/{1.73_m2} (ref >59)

## 2022-07-06 LAB — VITAMIN D 25 HYDROXY (VIT D DEFICIENCY, FRACTURES): Vit D, 25-Hydroxy: 26.6 ng/mL — ABNORMAL LOW (ref 30.0–100.0)

## 2022-07-06 LAB — THYROID PANEL WITH TSH
Free Thyroxine Index: 1.6 (ref 1.2–4.9)
T3 Uptake Ratio: 24 % (ref 24–39)
T4, Total: 6.6 ug/dL (ref 4.5–12.0)
TSH: 1.14 u[IU]/mL (ref 0.450–4.500)

## 2022-07-06 LAB — CBC WITH DIFFERENTIAL/PLATELET
Basophils Absolute: 0.1 10*3/uL (ref 0.0–0.2)
Basos: 1 %
EOS (ABSOLUTE): 0.3 10*3/uL (ref 0.0–0.4)
Eos: 3 %
Hematocrit: 41.2 % (ref 34.0–46.6)
Hemoglobin: 13.6 g/dL (ref 11.1–15.9)
Immature Grans (Abs): 0 10*3/uL (ref 0.0–0.1)
Immature Granulocytes: 0 %
Lymphocytes Absolute: 2.4 10*3/uL (ref 0.7–3.1)
Lymphs: 25 %
MCH: 32.3 pg (ref 26.6–33.0)
MCHC: 33 g/dL (ref 31.5–35.7)
MCV: 98 fL — ABNORMAL HIGH (ref 79–97)
Monocytes Absolute: 0.5 10*3/uL (ref 0.1–0.9)
Monocytes: 6 %
Neutrophils Absolute: 6.2 10*3/uL (ref 1.4–7.0)
Neutrophils: 65 %
Platelets: 278 10*3/uL (ref 150–450)
RBC: 4.21 x10E6/uL (ref 3.77–5.28)
RDW: 12.8 % (ref 11.7–15.4)
WBC: 9.5 10*3/uL (ref 3.4–10.8)

## 2022-07-06 LAB — LIPID PANEL
Chol/HDL Ratio: 2.3 ratio (ref 0.0–4.4)
Cholesterol, Total: 167 mg/dL (ref 100–199)
HDL: 73 mg/dL (ref >39)
LDL Chol Calc (NIH): 46 mg/dL (ref 0–99)
Triglycerides: 323 mg/dL — ABNORMAL HIGH (ref 0–149)
VLDL Cholesterol Cal: 48 mg/dL — ABNORMAL HIGH (ref 5–40)

## 2022-07-25 ENCOUNTER — Encounter: Payer: 59 | Admitting: Nurse Practitioner

## 2022-11-01 ENCOUNTER — Other Ambulatory Visit (HOSPITAL_BASED_OUTPATIENT_CLINIC_OR_DEPARTMENT_OTHER): Payer: Self-pay

## 2022-11-01 MED ORDER — FLULAVAL 0.5 ML IM SUSY
PREFILLED_SYRINGE | INTRAMUSCULAR | 0 refills | Status: DC
  Filled 2022-11-01: qty 0.5, 1d supply, fill #0

## 2023-01-23 ENCOUNTER — Other Ambulatory Visit (HOSPITAL_BASED_OUTPATIENT_CLINIC_OR_DEPARTMENT_OTHER): Payer: Self-pay

## 2023-01-23 ENCOUNTER — Telehealth: Payer: 59 | Admitting: Physician Assistant

## 2023-01-23 DIAGNOSIS — J069 Acute upper respiratory infection, unspecified: Secondary | ICD-10-CM | POA: Diagnosis not present

## 2023-01-23 DIAGNOSIS — B9689 Other specified bacterial agents as the cause of diseases classified elsewhere: Secondary | ICD-10-CM

## 2023-01-23 MED ORDER — PROMETHAZINE-DM 6.25-15 MG/5ML PO SYRP
5.0000 mL | ORAL_SOLUTION | Freq: Four times a day (QID) | ORAL | 0 refills | Status: DC | PRN
Start: 2023-01-23 — End: 2023-08-28
  Filled 2023-01-23: qty 118, 6d supply, fill #0

## 2023-01-23 MED ORDER — BENZONATATE 100 MG PO CAPS
100.0000 mg | ORAL_CAPSULE | Freq: Three times a day (TID) | ORAL | 0 refills | Status: DC | PRN
Start: 2023-01-23 — End: 2023-08-28
  Filled 2023-01-23: qty 30, 5d supply, fill #0

## 2023-01-23 MED ORDER — AZITHROMYCIN 250 MG PO TABS
ORAL_TABLET | ORAL | 0 refills | Status: AC
Start: 2023-01-23 — End: 2023-01-28
  Filled 2023-01-23: qty 6, 5d supply, fill #0

## 2023-01-23 NOTE — Addendum Note (Signed)
Addended by: Margaretann Loveless on: 01/23/2023 10:45 AM   Modules accepted: Orders

## 2023-01-23 NOTE — Progress Notes (Signed)
E-Visit for Cough  We are sorry that you are not feeling well.  Here is how we plan to help!  Based on your presentation I believe you most likely have A cough due to bacteria.  When patients have a fever and a productive cough with a change in color or increased sputum production, we are concerned about bacterial bronchitis.  If left untreated it can progress to pneumonia.  If your symptoms do not improve with your treatment plan it is important that you contact your provider.   I have prescribed Azithromyin 250 mg: two tablets now and then one tablet daily for 4 additonal days    In addition you may use A non-prescription cough medication called Mucinex DM: take 2 tablets every 12 hours. and A prescription cough medication called Tessalon Perles 100mg . You may take 1-2 capsules every 8 hours as needed for your cough.  From your responses in the eVisit questionnaire you describe inflammation in the upper respiratory tract which is causing a significant cough.  This is commonly called Bronchitis and has four common causes:   Allergies Viral Infections Acid Reflux Bacterial Infection Allergies, viruses and acid reflux are treated by controlling symptoms or eliminating the cause. An example might be a cough caused by taking certain blood pressure medications. You stop the cough by changing the medication. Another example might be a cough caused by acid reflux. Controlling the reflux helps control the cough.     HOME CARE Only take medications as instructed by your medical team. Complete the entire course of an antibiotic. Drink plenty of fluids and get plenty of rest. Avoid close contacts especially the very young and the elderly Cover your mouth if you cough or cough into your sleeve. Always remember to wash your hands A steam or ultrasonic humidifier can help congestion.   GET HELP RIGHT AWAY IF: You develop worsening fever. You become short of breath You cough up blood. Your symptoms  persist after you have completed your treatment plan MAKE SURE YOU  Understand these instructions. Will watch your condition. Will get help right away if you are not doing well or get worse.    Thank you for choosing an e-visit.  Your e-visit answers were reviewed by a board certified advanced clinical practitioner to complete your personal care plan. Depending upon the condition, your plan could have included both over the counter or prescription medications.  Please review your pharmacy choice. Make sure the pharmacy is open so you can pick up prescription now. If there is a problem, you may contact your provider through [employer] and have the prescription routed to another pharmacy.  Your safety is important to [place]. If you have drug allergies check your prescription carefully.   For the next 24 hours you can use MyChart to ask questions about today's visit, request a non-urgent call back, or ask for a work or school excuse. You will get an email in the next two days asking about your experience. I hope that your e-visit has been valuable and will speed your recovery.  I have spent 5 minutes in review of e-visit questionnaire, review and updating patient chart, medical decision making and response to patient.   Margaretann Loveless, PA-C

## 2023-08-28 ENCOUNTER — Other Ambulatory Visit (HOSPITAL_COMMUNITY)
Admission: RE | Admit: 2023-08-28 | Discharge: 2023-08-28 | Disposition: A | Discharge status: Home / Self Care | Source: Ambulatory Visit | Attending: Nurse Practitioner | Admitting: Nurse Practitioner

## 2023-08-28 ENCOUNTER — Ambulatory Visit (INDEPENDENT_AMBULATORY_CARE_PROVIDER_SITE_OTHER): Admitting: Nurse Practitioner

## 2023-08-28 ENCOUNTER — Encounter: Payer: Self-pay | Admitting: Nurse Practitioner

## 2023-08-28 VITALS — BP 131/83 | HR 72 | Temp 97.3°F | Ht 65.0 in | Wt 112.0 lb

## 2023-08-28 DIAGNOSIS — M545 Low back pain, unspecified: Secondary | ICD-10-CM | POA: Diagnosis not present

## 2023-08-28 DIAGNOSIS — Z0000 Encounter for general adult medical examination without abnormal findings: Secondary | ICD-10-CM | POA: Diagnosis not present

## 2023-08-28 DIAGNOSIS — Z0001 Encounter for general adult medical examination with abnormal findings: Secondary | ICD-10-CM

## 2023-08-28 DIAGNOSIS — R87610 Atypical squamous cells of undetermined significance on cytologic smear of cervix (ASC-US): Secondary | ICD-10-CM | POA: Insufficient documentation

## 2023-08-28 LAB — LIPID PANEL

## 2023-08-28 MED ORDER — KETOROLAC TROMETHAMINE 60 MG/2ML IM SOLN
60.0000 mg | Freq: Once | INTRAMUSCULAR | Status: AC
  Administered 2023-08-28: 60 mg via INTRAMUSCULAR

## 2023-08-28 MED ORDER — PREDNISONE 20 MG PO TABS: ORAL_TABLET | ORAL | 0 refills | Status: AC

## 2023-08-28 MED ORDER — METHYLPREDNISOLONE ACETATE 80 MG/ML IJ SUSP
80.0000 mg | Freq: Once | INTRAMUSCULAR | Status: AC
  Administered 2023-08-28: 80 mg via INTRAMUSCULAR

## 2023-08-28 NOTE — Patient Instructions (Signed)
 Exercising to Stay Healthy To become healthy and stay healthy, it is recommended that you do moderate-intensity and vigorous-intensity exercise. You can tell that you are exercising at a moderate intensity if your heart starts beating faster and you start breathing faster but can still hold a conversation. You can tell that you are exercising at a vigorous intensity if you are breathing much harder and faster and cannot hold a conversation while exercising. How can exercise benefit me? Exercising regularly is important. It has many health benefits, such as: Improving overall fitness, flexibility, and endurance. Increasing bone density. Helping with weight control. Decreasing body fat. Increasing muscle strength and endurance. Reducing stress and tension, anxiety, depression, or anger. Improving overall health. What guidelines should I follow while exercising? Before you start a new exercise program, talk with your health care provider. Do not exercise so much that you hurt yourself, feel dizzy, or get very short of breath. Wear comfortable clothes and wear shoes with good support. Drink plenty of water while you exercise to prevent dehydration or heat stroke. Work out until your breathing and your heartbeat get faster (moderate intensity). How often should I exercise? Choose an activity that you enjoy, and set realistic goals. Your health care provider can help you make an activity plan that is individually designed and works best for you. Exercise regularly as told by your health care provider. This may include: Doing strength training two times a week, such as: Lifting weights. Using resistance bands. Push-ups. Sit-ups. Yoga. Doing a certain intensity of exercise for a given amount of time. Choose from these options: A total of 150 minutes of moderate-intensity exercise every week. A total of 75 minutes of vigorous-intensity exercise every week. A mix of moderate-intensity and  vigorous-intensity exercise every week. Children, pregnant women, people who have not exercised regularly, people who are overweight, and older adults may need to talk with a health care provider about what activities are safe to perform. If you have a medical condition, be sure to talk with your health care provider before you start a new exercise program. What are some exercise ideas? Moderate-intensity exercise ideas include: Walking 1 mile (1.6 km) in about 15 minutes. Biking. Hiking. Golfing. Dancing. Water aerobics. Vigorous-intensity exercise ideas include: Walking 4.5 miles (7.2 km) or more in about 1 hour. Jogging or running 5 miles (8 km) in about 1 hour. Biking 10 miles (16.1 km) or more in about 1 hour. Lap swimming. Roller-skating or in-line skating. Cross-country skiing. Vigorous competitive sports, such as football, basketball, and soccer. Jumping rope. Aerobic dancing. What are some everyday activities that can help me get exercise? Yard work, such as: Child psychotherapist. Raking and bagging leaves. Washing your car. Pushing a stroller. Shoveling snow. Gardening. Washing windows or floors. How can I be more active in my day-to-day activities? Use stairs instead of an elevator. Take a walk during your lunch break. If you drive, park your car farther away from your work or school. If you take public transportation, get off one stop early and walk the rest of the way. Stand up or walk around during all of your indoor phone calls. Get up, stretch, and walk around every 30 minutes throughout the day. Enjoy exercise with a friend. Support to continue exercising will help you keep a regular routine of activity. Where to find more information You can find more information about exercising to stay healthy from: U.S. Department of Health and Human Services: [employer] Centers for Disease Control and Prevention (  CDC): [employer].br Summary Exercising regularly is  important. It will improve your overall fitness, flexibility, and endurance. Regular exercise will also improve your overall health. It can help you control your weight, reduce stress, and improve your bone density. Do not exercise so much that you hurt yourself, feel dizzy, or get very short of breath. Before you start a new exercise program, talk with your health care provider. This information is not intended to replace advice given to you by your health care provider. Make sure you discuss any questions you have with your health care provider. Document Revised: 04/17/2020 Document Reviewed: 04/17/2020 Elsevier Patient Education  2024 [employer].

## 2023-08-28 NOTE — Progress Notes (Addendum)
 Subjective:    Patient ID: Powell JONELLE Dustman, female    DOB: 09-Apr-1985, 38 y.o.   MRN: [id number]   Chief Complaint: annual physical   HPI:  YANNELY KINTZEL is a 38 y.o. who identifies as a female who was assigned female at birth.   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 Annual physical exam No issues today. Pap was done 2023- [facility]- will repeat today   New complaints: Low back pain for several weeks. Has seen chiropractor a couple of time. Rates 4/10 currently. Standing and walking at work increases pain. Sitting helps some  No Known Allergies Outpatient Encounter Medications as of 08/28/2023  Medication Sig   benzonatate  (TESSALON ) 100 MG capsule Take 1-2 capsules (100-200 mg total) by mouth 3 (three) times daily as needed.   cyclobenzaprine  (FLEXERIL ) 5 MG tablet Take 1 tablet (5 mg total) by mouth 3 (three) times daily as needed for muscle spasms.   fluticasone  (FLONASE ) 50 MCG/ACT nasal spray Place 2 sprays into both nostrils daily. (Patient not taking: Reported on 07/05/2022)   influenza vac split trivalent PF (FLULAVAL ) 0.5 ML injection Inject into the muscle.   meclizine  (ANTIVERT ) 25 MG tablet Take 1 tablet (25 mg total) by mouth 3 (three) times daily as needed for dizziness. (Patient not taking: Reported on 07/05/2022)   methocarbamol  (ROBAXIN ) 500 MG tablet Take 1 tablet (500 mg total) by mouth 2 (two) times daily.   promethazine -dextromethorphan (PROMETHAZINE -DM) 6.25-15 MG/5ML syrup Take 5 mLs by mouth 4 (four) times daily as needed.   No facility-administered encounter medications on file as of 08/28/2023.    Past Surgical History:  Procedure Laterality Date   DENTAL SURGERY  2006   dental implants 2006   SACROILIAC JOINT FUSION Left 01/18/2018   Procedure: LEFT SACROILIAC JOINT FUSION;  Surgeon: Beuford Anes, MD;  Location: [facility] OR;  Service: Orthopedics;   Laterality: Left;    No family history on file.    Controlled substance contract: n/a     Review of Systems  Constitutional:  Negative for diaphoresis.  Eyes:  Negative for pain.  Respiratory:  Negative for shortness of breath.   Cardiovascular:  Negative for chest pain, palpitations and leg swelling.  Gastrointestinal:  Negative for abdominal pain.  Endocrine: Negative for polydipsia.  Musculoskeletal:  Positive for back pain.  Skin:  Negative for rash.  Neurological:  Negative for dizziness, weakness and headaches.  Hematological:  Does not bruise/bleed easily.  All other systems reviewed and are negative.      Objective:   Physical Exam Vitals and nursing note reviewed.  Constitutional:      General: She is not in acute distress.    Appearance: Normal appearance. She is well-developed.  HENT:     Head: Normocephalic.     Right Ear: Tympanic membrane normal.     Left Ear: Tympanic membrane normal.     Nose: Nose normal.     Mouth/Throat:     Mouth: Mucous membranes are moist.  Eyes:     Pupils: Pupils are equal, round, and reactive to light.  Neck:     Vascular: No carotid bruit or JVD.  Cardiovascular:     Rate and Rhythm: Normal rate and regular rhythm.     Heart sounds: Normal heart sounds.  Pulmonary:     Effort: Pulmonary effort is normal. No respiratory distress.  Breath sounds: Normal breath sounds. No wheezing or rales.  Chest:     Chest wall: No tenderness.  Abdominal:     General: Bowel sounds are normal. There is no distension or abdominal bruit.     Palpations: Abdomen is soft. There is no hepatomegaly, splenomegaly, mass or pulsatile mass.     Tenderness: There is no abdominal tenderness.  Genitourinary:    General: Normal vulva.     Vagina: No vaginal discharge.     Rectum: Normal.     Comments: Cervix nonparous and pink NO adnexal masses or tenderness  Musculoskeletal:        General: Normal range of motion.     Cervical back:  Normal range of motion and neck supple.     Comments: FROM of lumbar spine with pain on extension  Lymphadenopathy:     Cervical: No cervical adenopathy.  Skin:    General: Skin is warm and dry.  Neurological:     Mental Status: She is alert and oriented to person, place, and time.     Deep Tendon Reflexes: Reflexes are normal and symmetric.  Psychiatric:        Behavior: Behavior normal.        Thought Content: Thought content normal.        Judgment: Judgment normal.     BP 131/83   Pulse 72   Temp (!) 97.3 F (36.3 C) (Temporal)   Ht 5' 5 (1.651 m)   Wt 112 lb (50.8 kg)   BMI 18.64 kg/m         Assessment & Plan:   ANNALICIA RENFREW comes in today with chief complaint of annual physical   Diagnosis and orders addressed:  1. Annual physical exam - CBC with Differential/Platelet - CMP14+EGFR - Lipid panel - Thyroid  Panel With TSH - VITAMIN D  25 Hydroxy (Vit-D Deficiency, Fractures) - PAP  2. Abnormal pap Repeat pap today  3. Low back pain Moist heat Daily stretching Toradol  and depomedrol IM today Prednisone  20mg  2po daily for 5 days  Labs pending Health Maintenance reviewed Diet and exercise encouraged  Follow up plan: 1 year   Mary-Margaret Gladis, FNP

## 2023-08-28 NOTE — Addendum Note (Signed)
 Addended by: Toinette Lackie, MARY-MARGARET on: 08/28/2023 03:28 PM   Modules accepted: Orders

## 2023-08-29 ENCOUNTER — Ambulatory Visit: Payer: Self-pay | Admitting: Nurse Practitioner

## 2023-08-29 LAB — THYROID PANEL WITH TSH
Free Thyroxine Index: 1.2 (ref 1.2–4.9)
T3 Uptake Ratio: 23 — AB (ref 24–39)
T4, Total: 5.3 ug/dL (ref 4.5–12.0)
TSH: 1.41 u[IU]/mL (ref 0.450–4.500)

## 2023-08-29 LAB — CBC WITH DIFFERENTIAL/PLATELET
Basophils Absolute: 0 x10E3/uL (ref 0.0–0.2)
Basos: 1 %
EOS (ABSOLUTE): 0.4 x10E3/uL (ref 0.0–0.4)
Eos: 5 %
Hematocrit: 39.1 % (ref 34.0–46.6)
Hemoglobin: 13 g/dL (ref 11.1–15.9)
Immature Grans (Abs): 0 x10E3/uL (ref 0.0–0.1)
Immature Granulocytes: 0 %
Lymphocytes Absolute: 2.7 x10E3/uL (ref 0.7–3.1)
Lymphs: 33 %
MCH: 32.7 pg (ref 26.6–33.0)
MCHC: 33.2 g/dL (ref 31.5–35.7)
MCV: 99 fL — ABNORMAL HIGH (ref 79–97)
Monocytes Absolute: 0.5 x10E3/uL (ref 0.1–0.9)
Monocytes: 7 %
Neutrophils Absolute: 4.4 x10E3/uL (ref 1.4–7.0)
Neutrophils: 54 %
Platelets: 256 x10E3/uL (ref 150–450)
RBC: 3.97 x10E6/uL (ref 3.77–5.28)
RDW: 13.1 % (ref 11.7–15.4)
WBC: 8.1 x10E3/uL (ref 3.4–10.8)

## 2023-08-29 LAB — LIPID PANEL
Cholesterol, Total: 156 mg/dL (ref 100–199)
HDL: 69 mg/dL (ref >39)
LDL CALC COMMENT:: 2.3 ratio (ref 0.0–4.4)
LDL Chol Calc (NIH): 43 mg/dL (ref 0–99)
Triglycerides: 297 mg/dL — AB (ref 0–149)
VLDL Cholesterol Cal: 44 mg/dL — AB (ref 5–40)

## 2023-08-29 LAB — VITAMIN D 25 HYDROXY (VIT D DEFICIENCY, FRACTURES): Vit D, 25-Hydroxy: 24.4 ng/mL — AB (ref 30.0–100.0)

## 2023-08-29 LAB — CMP14+EGFR
ALT: 16 IU/L (ref 0–32)
AST: 18 IU/L (ref 0–40)
Albumin: 4.5 g/dL (ref 3.9–4.9)
Alkaline Phosphatase: 54 IU/L (ref 44–121)
BUN/Creatinine Ratio: 15 (ref 9–23)
BUN: 10 mg/dL (ref 6–20)
Bilirubin Total: 0.4 mg/dL (ref 0.0–1.2)
CO2: 24 mmol/L (ref 20–29)
Calcium: 9.2 mg/dL (ref 8.7–10.2)
Chloride: 101 mmol/L (ref 96–106)
Creatinine, Ser: 0.65 mg/dL (ref 0.57–1.00)
Globulin, Total: 2.4 g/dL (ref 1.5–4.5)
Glucose: 78 mg/dL (ref 70–99)
Potassium: 4.2 mmol/L (ref 3.5–5.2)
Sodium: 138 mmol/L (ref 134–144)
Total Protein: 6.9 g/dL (ref 6.0–8.5)
eGFR: 115 mL/min/1.73 (ref >59)

## 2023-08-31 LAB — CYTOLOGY - PAP
Chlamydia: NEGATIVE
Comment: NEGATIVE
Comment: NEGATIVE
Comment: NORMAL
Diagnosis: NEGATIVE
Neisseria Gonorrhea: NEGATIVE
Trichomonas: NEGATIVE

## 2023-10-06 ENCOUNTER — Other Ambulatory Visit (HOSPITAL_BASED_OUTPATIENT_CLINIC_OR_DEPARTMENT_OTHER): Payer: Self-pay

## 2023-10-06 DIAGNOSIS — M25512 Pain in left shoulder: Secondary | ICD-10-CM | POA: Diagnosis not present

## 2023-10-06 MED ORDER — CELECOXIB 100 MG PO CAPS
100.0000 mg | ORAL_CAPSULE | Freq: Two times a day (BID) | ORAL | 0 refills | Status: AC
  Filled 2023-10-06: qty 28, 14d supply, fill #0

## 2023-11-03 ENCOUNTER — Other Ambulatory Visit (HOSPITAL_BASED_OUTPATIENT_CLINIC_OR_DEPARTMENT_OTHER): Payer: Self-pay

## 2023-11-03 MED ORDER — FLUZONE 0.5 ML IM SUSY
0.5000 mL | PREFILLED_SYRINGE | Freq: Once | INTRAMUSCULAR | 0 refills | Status: AC
  Filled 2023-11-03: qty 0.5, 1d supply, fill #0

## 2023-11-07 DIAGNOSIS — L814 Other melanin hyperpigmentation: Secondary | ICD-10-CM | POA: Diagnosis not present

## 2023-11-07 DIAGNOSIS — D485 Neoplasm of uncertain behavior of skin: Secondary | ICD-10-CM | POA: Diagnosis not present

## 2023-11-07 DIAGNOSIS — D2372 Other benign neoplasm of skin of left lower limb, including hip: Secondary | ICD-10-CM | POA: Diagnosis not present

## 2023-11-07 DIAGNOSIS — L301 Dyshidrosis [pompholyx]: Secondary | ICD-10-CM | POA: Diagnosis not present

## 2023-11-07 DIAGNOSIS — D1801 Hemangioma of skin and subcutaneous tissue: Secondary | ICD-10-CM | POA: Diagnosis not present

## 2023-11-07 DIAGNOSIS — L2089 Other atopic dermatitis: Secondary | ICD-10-CM | POA: Diagnosis not present

## 2023-11-07 DIAGNOSIS — L821 Other seborrheic keratosis: Secondary | ICD-10-CM | POA: Diagnosis not present
